# Patient Record
Sex: Female | Born: 1985 | Hispanic: Yes | Marital: Single | Smoking: Never smoker
Health system: Southern US, Community
[De-identification: ages and names within clinical notes are randomized; demographics above are authoritative.]

## PROBLEM LIST (undated history)

## (undated) ENCOUNTER — Inpatient Hospital Stay (HOSPITAL_COMMUNITY): Payer: Self-pay

## (undated) DIAGNOSIS — M1990 Unspecified osteoarthritis, unspecified site: Secondary | ICD-10-CM

## (undated) DIAGNOSIS — Z3480 Encounter for supervision of other normal pregnancy, unspecified trimester: Secondary | ICD-10-CM

## (undated) DIAGNOSIS — Z789 Other specified health status: Secondary | ICD-10-CM

## (undated) DIAGNOSIS — R519 Headache, unspecified: Secondary | ICD-10-CM

## (undated) DIAGNOSIS — F32A Depression, unspecified: Secondary | ICD-10-CM

## (undated) DIAGNOSIS — D649 Anemia, unspecified: Secondary | ICD-10-CM

## (undated) HISTORY — DX: Headache, unspecified: R51.9

## (undated) HISTORY — PX: WISDOM TOOTH EXTRACTION: SHX21

## (undated) HISTORY — DX: Unspecified osteoarthritis, unspecified site: M19.90

## (undated) HISTORY — DX: Anemia, unspecified: D64.9

## (undated) HISTORY — DX: Depression, unspecified: F32.A

---
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
## 2007-05-04 ENCOUNTER — Ambulatory Visit (HOSPITAL_COMMUNITY): Admission: RE | Admit: 2007-05-04 | Discharge: 2007-05-04 | Payer: Self-pay | Admitting: Obstetrics & Gynecology

## 2007-05-27 ENCOUNTER — Inpatient Hospital Stay (HOSPITAL_COMMUNITY): Admission: AD | Admit: 2007-05-27 | Discharge: 2007-05-27 | Payer: Self-pay | Admitting: Family Medicine

## 2007-05-27 ENCOUNTER — Ambulatory Visit: Payer: Self-pay | Admitting: Obstetrics and Gynecology

## 2007-08-31 ENCOUNTER — Inpatient Hospital Stay (HOSPITAL_COMMUNITY): Admission: AD | Admit: 2007-08-31 | Discharge: 2007-09-02 | Payer: Self-pay | Admitting: Obstetrics & Gynecology

## 2007-08-31 ENCOUNTER — Ambulatory Visit: Payer: Self-pay | Admitting: Obstetrics and Gynecology

## 2009-02-15 ENCOUNTER — Emergency Department (HOSPITAL_COMMUNITY): Admission: EM | Admit: 2009-02-15 | Discharge: 2009-02-15 | Payer: Self-pay | Admitting: Emergency Medicine

## 2009-08-27 ENCOUNTER — Emergency Department (HOSPITAL_BASED_OUTPATIENT_CLINIC_OR_DEPARTMENT_OTHER): Admission: EM | Admit: 2009-08-27 | Discharge: 2009-08-27 | Payer: Self-pay | Admitting: Emergency Medicine

## 2009-10-23 ENCOUNTER — Emergency Department (HOSPITAL_BASED_OUTPATIENT_CLINIC_OR_DEPARTMENT_OTHER): Admission: EM | Admit: 2009-10-23 | Discharge: 2009-10-23 | Payer: Self-pay | Admitting: Emergency Medicine

## 2009-11-25 ENCOUNTER — Emergency Department (HOSPITAL_BASED_OUTPATIENT_CLINIC_OR_DEPARTMENT_OTHER): Admission: EM | Admit: 2009-11-25 | Discharge: 2009-11-25 | Payer: Self-pay | Admitting: Emergency Medicine

## 2010-04-30 ENCOUNTER — Ambulatory Visit: Payer: Self-pay | Admitting: Advanced Practice Midwife

## 2010-04-30 ENCOUNTER — Inpatient Hospital Stay (HOSPITAL_COMMUNITY): Admission: AD | Admit: 2010-04-30 | Discharge: 2010-04-30 | Payer: Self-pay | Admitting: Obstetrics & Gynecology

## 2010-06-02 ENCOUNTER — Inpatient Hospital Stay (HOSPITAL_COMMUNITY): Admission: AD | Admit: 2010-06-02 | Discharge: 2010-06-02 | Payer: Self-pay | Admitting: Obstetrics and Gynecology

## 2010-06-02 ENCOUNTER — Ambulatory Visit: Payer: Self-pay | Admitting: Gynecology

## 2010-11-28 LAB — URINALYSIS, ROUTINE W REFLEX MICROSCOPIC
Bilirubin Urine: NEGATIVE
Ketones, ur: NEGATIVE mg/dL
Protein, ur: NEGATIVE mg/dL

## 2010-11-29 LAB — URINALYSIS, ROUTINE W REFLEX MICROSCOPIC
Bilirubin Urine: NEGATIVE
Glucose, UA: NEGATIVE mg/dL
Ketones, ur: 15 mg/dL — AB
Nitrite: NEGATIVE
Protein, ur: NEGATIVE mg/dL
Specific Gravity, Urine: 1.025 (ref 1.005–1.030)
pH: 5.5 (ref 5.0–8.0)

## 2010-11-29 LAB — URINE MICROSCOPIC-ADD ON

## 2010-12-05 ENCOUNTER — Inpatient Hospital Stay (HOSPITAL_COMMUNITY)
Admission: AD | Admit: 2010-12-05 | Discharge: 2010-12-07 | DRG: 775 | Disposition: A | Discharge status: Home / Self Care | Payer: Medicaid Other | Source: Ambulatory Visit | Attending: Obstetrics and Gynecology | Admitting: Obstetrics and Gynecology

## 2010-12-05 DIAGNOSIS — O99892 Other specified diseases and conditions complicating childbirth: Principal | ICD-10-CM | POA: Diagnosis present

## 2010-12-05 DIAGNOSIS — Z22330 Carrier of Group B streptococcus: Secondary | ICD-10-CM

## 2010-12-05 LAB — URINALYSIS, ROUTINE W REFLEX MICROSCOPIC
Bilirubin Urine: NEGATIVE
Glucose, UA: NEGATIVE mg/dL
Ketones, ur: NEGATIVE mg/dL
Nitrite: POSITIVE — AB
Protein, ur: 30 mg/dL — AB
Specific Gravity, Urine: 1.017 (ref 1.005–1.030)
Urobilinogen, UA: 1 mg/dL (ref 0.0–1.0)
pH: 5.5 (ref 5.0–8.0)

## 2010-12-05 LAB — CBC
HCT: 31.3 % — ABNORMAL LOW (ref 36.0–46.0)
MCV: 70 fL — ABNORMAL LOW (ref 78.0–100.0)
RDW: 15.7 % — ABNORMAL HIGH (ref 11.5–15.5)
WBC: 7.6 10*3/uL (ref 4.0–10.5)

## 2010-12-05 LAB — PREGNANCY, URINE: Preg Test, Ur: NEGATIVE

## 2010-12-05 LAB — URINE MICROSCOPIC-ADD ON

## 2010-12-05 LAB — RPR: RPR Ser Ql: NONREACTIVE

## 2010-12-05 LAB — ABO/RH: ABO/RH(D): O POS

## 2010-12-06 LAB — CBC
Hemoglobin: 9 g/dL — ABNORMAL LOW (ref 12.0–15.0)
RDW: 15.9 % — ABNORMAL HIGH (ref 11.5–15.5)
WBC: 10.8 10*3/uL — ABNORMAL HIGH (ref 4.0–10.5)

## 2010-12-17 LAB — CBC
Hemoglobin: 10.7 g/dL — ABNORMAL LOW (ref 12.0–15.0)
MCHC: 33.1 g/dL (ref 30.0–36.0)
Platelets: 309 10*3/uL (ref 150–400)
RBC: 4.42 MIL/uL (ref 3.87–5.11)
RDW: 15.9 % — ABNORMAL HIGH (ref 11.5–15.5)
WBC: 8.9 10*3/uL (ref 4.0–10.5)

## 2010-12-17 LAB — BASIC METABOLIC PANEL
BUN: 8 mg/dL (ref 6–23)
Calcium: 8.3 mg/dL — ABNORMAL LOW (ref 8.4–10.5)
Chloride: 104 mEq/L (ref 96–112)
Creatinine, Ser: 0.6 mg/dL (ref 0.4–1.2)
GFR calc non Af Amer: >60 mL/min (ref >60)
Sodium: 140 mEq/L (ref 135–145)

## 2010-12-17 LAB — URINALYSIS, ROUTINE W REFLEX MICROSCOPIC
Nitrite: NEGATIVE
Protein, ur: NEGATIVE mg/dL

## 2010-12-17 LAB — DIFFERENTIAL
Eosinophils Absolute: 0.1 10*3/uL (ref 0.0–0.7)
Eosinophils Relative: 1 % (ref 0–5)
Lymphocytes Relative: 27 % (ref 12–46)
Monocytes Absolute: 0.4 10*3/uL (ref 0.1–1.0)
Neutrophils Relative %: 67 % (ref 43–77)

## 2010-12-17 LAB — URINE MICROSCOPIC-ADD ON

## 2010-12-19 NOTE — Discharge Summary (Signed)
  NAMEJADEA, [name]               ACCOUNT NO.:  [id number]  MEDICAL RECORD NO.:  [id number]           PATIENT TYPE:  I  LOCATION:  9117                          FACILITY:  [facility]  PHYSICIAN:  Malachi Pro. Ambrose Mantle, M.D. DATE OF BIRTH:  12-31-1985  DATE OF ADMISSION:  12/05/2010 DATE OF DISCHARGE:  12/07/2010                              DISCHARGE SUMMARY   This is a 25 year old Hispanic female para 2-0-0-2 gravida 3 at 38+ weeks' gestation in labor.  Cervix changed from 2 cm in the office to 4 cm in the Maternity Admission Unit, uncomplicated prenatal course, low platelets throughout the care, 75,000 at the initial OB visit.  She had a past medical history of migraines, pyelonephritis.  OBSTETRIC HISTORY:  She had deliveries in 2007 and 2008.  She took prenatal vitamins.  She was not allergic to medications.  SOCIAL HISTORY:  No alcohol, tobacco or drugs.  Family history of diabetes and kidney stones.  PRENATAL LABORATORY FINDINGS:  Blood group and type was O+ with a negative antibody, RPR was nonreactive, hepatitis B negative, HIV negative, GC and Chlamydia negative, cystic fibrosis declined.     Malachi Pro. Ambrose Mantle, M.D.     TFH/MEDQ  D:  12/07/2010  T:  12/08/2010  Job:  [id number]  Electronically Signed by Tracey Harries M.D. on 12/19/2010 08:47:15 AM

## 2010-12-19 NOTE — Discharge Summary (Signed)
  [name], [name]               ACCOUNT NO.:  [id number]  MEDICAL RECORD NO.:  [id number]           PATIENT TYPE:  I  LOCATION:  9117                          FACILITY:  [facility]  PHYSICIAN:  Malachi Pro. Ambrose Mantle, M.D. DATE OF BIRTH:  12-25-1985  DATE OF ADMISSION:  12/05/2010 DATE OF DISCHARGE:  12/07/2010                              DISCHARGE SUMMARY   HISTORY OF PRESENT ILLNESS:  A 25 year old Hispanic female, para 2-0-0- 2, gravida 3 at 59 weeks' gestation, admitted in labor.  Cervix changed from 2 cm in the office to 4 cm in the Maternity Admission Unit. Prenatal course uncomplicated except for low platelets throughout the care 75,000. Initial OB visit, blood group and type O positive with a negative antibody, first trimester screen was normal.  AFP was normal. Pap smear normal, rubella immune, RPR nonreactive, hepatitis B surface antigen negative, HIV negative, platelet count 75,000, GC and Chlamydia negative, cystic fibrosis declined, 1-hour Glucola 88.  PAST MEDICAL HISTORY:  Migraines, anemia, and pyelonephritis.  SURGICAL HISTORY:  Wisdom teeth extracted.  OBSTETRICAL HISTORY:  In October 2007 and December 2008, she had vaginal deliveries 7 pounds 8 ounces female and 6 pounds 4 ounces female.  She had no history of abnormal Paps or STDs.  MEDICATIONS:  Prenatal vitamins.  ALLERGIES:  No known drug allergies.  No latex allergies.  SOCIAL HISTORY:  No alcohol, tobacco, or drugs.  FAMILY HISTORY:  Diabetes, kidney stones.  HOSPITAL COURSE:  On admission her vital signs were normal.  Physical exam was normal.  Cervix 4 cm, 90%.  The patient was positive for group B strep.  She was placed on amoxicillin.  She progressed to 8 cm and by 8:20 a.m. she had delivered a living female infant 6 pounds 13 ounces, Apgars 8 and 9, no lacerations.  Placenta delivered intact.  Cervix and rectum were intact.  Dr. Senaida Ores was in attendance.  Postpartum, the patient did well and was  discharged on the second postpartum day. Initial hemoglobin 9.3, hematocrit 31.3, white count 7600, platelet count 285,000, RPR nonreactive.  Blood group and type O positive. Followup hemoglobin 9.0 and platelet count 271,000.  FINAL DIAGNOSIS:  Intrauterine pregnancy at 38 weeks, delivered vertex.  OPERATION:  Spontaneous delivery vertex.  FINAL CONDITION:  Improved.  INSTRUCTIONS:  Our regular discharge instruction booklet.  DISCHARGE MEDICATIONS: 1. Continue prenatal vitamins. 2. Take ferrous sulfate 325 mg twice daily. 3. Percocet 5/325 15 tablets one every 4-6 hours as needed for pain. 4. Motrin 600 mg 30 tablets one every 6 hours as needed for pain.     Malachi Pro. Ambrose Mantle, M.D.     TFH/MEDQ  D:  12/07/2010  T:  12/08/2010  Job:  [id number]  Electronically Signed by Tracey Harries M.D. on 12/19/2010 08:47:29 AM

## 2010-12-23 LAB — URINE CULTURE

## 2010-12-23 LAB — URINALYSIS, ROUTINE W REFLEX MICROSCOPIC
Bilirubin Urine: NEGATIVE
Hgb urine dipstick: NEGATIVE
Nitrite: NEGATIVE
Specific Gravity, Urine: 1.022 (ref 1.005–1.030)
Urobilinogen, UA: 1 mg/dL (ref 0.0–1.0)

## 2010-12-23 LAB — POCT PREGNANCY, URINE: Preg Test, Ur: NEGATIVE

## 2011-06-20 LAB — CBC
HCT: 31.8 — ABNORMAL LOW
HCT: 35.8 — ABNORMAL LOW
Hemoglobin: 10.9 — ABNORMAL LOW
MCV: 82.3
Platelets: 272
RBC: 3.87
RBC: 4.37
WBC: 14.1 — ABNORMAL HIGH
WBC: 9.2

## 2011-06-20 LAB — RPR: RPR Ser Ql: NONREACTIVE

## 2011-06-27 LAB — URINE MICROSCOPIC-ADD ON

## 2011-06-27 LAB — COMPREHENSIVE METABOLIC PANEL
ALT: 16
AST: 17
Albumin: 2.7 — ABNORMAL LOW
CO2: 24
Calcium: 8.1 — ABNORMAL LOW
Chloride: 106
Creatinine, Ser: 0.43
GFR calc Af Amer: >60
Sodium: 135
Total Bilirubin: 0.6

## 2011-06-27 LAB — URINALYSIS, ROUTINE W REFLEX MICROSCOPIC
Bilirubin Urine: NEGATIVE
Glucose, UA: NEGATIVE
Hgb urine dipstick: NEGATIVE
Ketones, ur: NEGATIVE
Nitrite: NEGATIVE
Specific Gravity, Urine: 1.01
pH: 8

## 2011-06-27 LAB — CBC
MCV: 83.5
Platelets: 237
RBC: 3.86 — ABNORMAL LOW
WBC: 8.3

## 2011-06-27 LAB — AMYLASE: Amylase: 75

## 2011-06-27 LAB — LIPASE, BLOOD: Lipase: 15

## 2011-12-25 ENCOUNTER — Encounter (HOSPITAL_BASED_OUTPATIENT_CLINIC_OR_DEPARTMENT_OTHER): Payer: Self-pay | Admitting: Family Medicine

## 2011-12-25 ENCOUNTER — Emergency Department (INDEPENDENT_AMBULATORY_CARE_PROVIDER_SITE_OTHER): Payer: Medicaid Other

## 2011-12-25 ENCOUNTER — Emergency Department (HOSPITAL_BASED_OUTPATIENT_CLINIC_OR_DEPARTMENT_OTHER)
Admission: EM | Admit: 2011-12-25 | Discharge: 2011-12-25 | Disposition: A | Discharge status: Home / Self Care | Payer: Medicaid Other | Attending: Emergency Medicine | Admitting: Emergency Medicine

## 2011-12-25 DIAGNOSIS — X500XXA Overexertion from strenuous movement or load, initial encounter: Secondary | ICD-10-CM

## 2011-12-25 DIAGNOSIS — S93409A Sprain of unspecified ligament of unspecified ankle, initial encounter: Secondary | ICD-10-CM | POA: Insufficient documentation

## 2011-12-25 DIAGNOSIS — M25579 Pain in unspecified ankle and joints of unspecified foot: Secondary | ICD-10-CM | POA: Insufficient documentation

## 2011-12-25 DIAGNOSIS — S8990XA Unspecified injury of unspecified lower leg, initial encounter: Secondary | ICD-10-CM | POA: Insufficient documentation

## 2011-12-25 DIAGNOSIS — S93401A Sprain of unspecified ligament of right ankle, initial encounter: Secondary | ICD-10-CM

## 2011-12-25 DIAGNOSIS — S99929A Unspecified injury of unspecified foot, initial encounter: Secondary | ICD-10-CM | POA: Insufficient documentation

## 2011-12-25 DIAGNOSIS — M25473 Effusion, unspecified ankle: Secondary | ICD-10-CM | POA: Insufficient documentation

## 2011-12-25 DIAGNOSIS — M7989 Other specified soft tissue disorders: Secondary | ICD-10-CM

## 2011-12-25 DIAGNOSIS — M25476 Effusion, unspecified foot: Secondary | ICD-10-CM | POA: Insufficient documentation

## 2011-12-25 MED ORDER — OXYCODONE-ACETAMINOPHEN 5-325 MG PO TABS: 1.0000 | ORAL_TABLET | ORAL | Status: AC | PRN

## 2011-12-25 MED ORDER — IBUPROFEN 600 MG PO TABS: 600.0000 mg | ORAL_TABLET | Freq: Three times a day (TID) | ORAL | Status: AC | PRN

## 2011-12-25 MED ORDER — OXYCODONE-ACETAMINOPHEN 5-325 MG PO TABS
2.0000 | ORAL_TABLET | Freq: Once | ORAL | Status: AC
  Administered 2011-12-25: 2 via ORAL
  Filled 2011-12-25: qty 2

## 2011-12-25 NOTE — ED Provider Notes (Signed)
History     CSN: [id number]  Arrival date & time 12/25/11  1016   First MD Initiated Contact with Patient 12/25/11 1053      Chief Complaint  Patient presents with  . Ankle Injury     The history is provided by the patient.   the patient reports yesterday she was walking down steps to take the garbage out when she stepped wrong and externally rotated her right foot with inversion of her right foot.  She reports she felt and heard a snap.  She has been able to walk on her right ankle some but reports it has significant pain.  The pain is worsened by palpation movement.  Nothing improves her pain.  Pain is constant.  Her pain is moderate in severity.  She has no other complaints.  She denies numbness and tingling.  She denies weakness.  History reviewed. No pertinent past medical history.  History reviewed. No pertinent past surgical history.  History reviewed. No pertinent family history.  History  Substance Use Topics  . Smoking status: Never Smoker   . Smokeless tobacco: Not on file  . Alcohol Use:     OB History    Grav Para Term Preterm Abortions TAB SAB Ect Mult Living                  Review of Systems  All other systems reviewed and are negative.    Allergies  Review of patient's allergies indicates no known allergies.  Home Medications  No current outpatient prescriptions on file.  BP 122/77  Pulse 71  Temp(Src) 98.5 F (36.9 C) (Oral)  Resp 20  Ht 5\' 5"  (1.651 m)  Wt 160 lb (72.576 kg)  BMI 26.63 kg/m2  SpO2 99%  Physical Exam  Nursing note and vitals reviewed. Constitutional: She is oriented to person, place, and time. She appears well-developed and well-nourished. No distress.  HENT:  Head: Normocephalic and atraumatic.  Eyes: EOM are normal.  Neck: Normal range of motion.  Cardiovascular: Normal rate, regular rhythm and normal heart sounds.   Pulmonary/Chest: Effort normal and breath sounds normal.  Abdominal: Soft. She exhibits no  distension. There is no tenderness.  Musculoskeletal: Normal range of motion.       Tenderness of right lateral malleolus with associated swelling and discoloration.  Normal pulses in her right foot.  Pain with range of motion  Neurological: She is alert and oriented to person, place, and time.  Skin: Skin is warm and dry.  Psychiatric: She has a normal mood and affect. Judgment normal.    ED Course  Procedures (including critical care time)  Labs Reviewed - No data to display Dg Ankle Complete Right  12/25/2011  *RADIOLOGY REPORT*  Clinical Data: Larey Seat down stairs twisting ankle with pain and swelling laterally  RIGHT ANKLE - COMPLETE 3+ VIEW  Comparison: None.  Findings: No acute fracture is seen.  Alignment is normal.  The ankle joint appears normal.  There is soft tissue swelling over the lateral malleolus.  IMPRESSION:   No acute fracture.  Original Report Authenticated By: Juline Patch, M.D.   I personally reviewed the images  1. Right ankle sprain       MDM  Concerning for sprain versus lateral malleolus fracture.  Pain treated.  Images pending  11:45 AM The patient feels much better at this time.  Rice therapy instructed.  Home with crutches and an ASO brace.  Orthopedic surgeon followup in 7-10 days if not  improved.  Patient understands       Lyanne Co, MD 12/25/11 1145

## 2011-12-25 NOTE — ED Notes (Signed)
Pt to room 1 in w/c, reports twisting ankle yesterday on steps, increased swellling and contusions today. Pt states she is able to wb with pain. + pedal pulse, + mvt of toes.

## 2011-12-25 NOTE — Discharge Instructions (Signed)
Esguince de tobillo  (Ankle Sprain)  Un esguince es un traumatismo de los ligamentos que sostienen los huesos juntos.  CAUSAS  La causa de la lesin generalmente es una cada o torcedura del tobillo. Es muy importante que contarle al profesional el modo en que ocurri el traumatismo y si pudo caminar o no inmediatamente despus de ocurrida la lesin.   SNTOMAS  El dolor es el sntoma principal. Puede presentarse durante el descanso o slo cuando trata de pararse o caminar. Es probable que el tobillo se hinche y aparezca un hematoma inmediatamente despus de la lesin o luego de uno o dos das. Puede resultar difcil o imposible pararse a caminar, segn la gravedad del esguince.  DIAGNSTICO  El profesional podr determinar si se trata de un esguince segn los detalles del accidente y basndose en el examen del tobillo. El examen incluir la presin y palpacin de las algunas zonas del pie y el tobillo, intentando moverlo en determinadas direcciones.. Le tomarn radiografas para verificar que el hueso no se haya roto, o que el ligamento no se haya separado del hueso (avulsin). Existen guas estndar que pueden determinar de manera confiable si es necesario tomar radiografas.  TRATAMIENTO  Reposo, hielo, elevacin y compresin del miembro son modos bsicos de tratamiento. Ciertos tipos de dispositivos ortopdicos pueden ayudar a estabilizar el tobillo y permitirle caminar ms rpido. El profesional que lo asiste le dar las indicaciones. Le prescribirn medicamentos para aliviar el dolor. Ser derivado a un ortopedista y a un fisioterapeuta si sufre un caso de esguince grave.  INSTRUCCIONES PARA EL CUIDADO DOMICILIARIO   Aplique hielo sobre la lesin durante 15 a 20 minutos 3 a 4 veces por da mientras se encuentre despierto, durante los 2 primeros das o segn se le indique. Deber suspenderlo cuando disminuya la hinchazn. Ponga el hielo en una bolsa plstica y coloque una toalla entre la bolsa y la  piel.   Mantenga la pierna elevada cuando le sea posible para disminuir la hinchazn.   Para realizar actividades: Si el profesional que lo asiste le ha indicado el uso de muletas, utilcelas segn las instrucciones y no soporte peso durante una semana. Luego podr caminar sobre el tobillo a medida que el dolor se lo permita, o segn se le haya indicado. Comience gradualmente soportando el peso sobre la pierna afectada. Contine usando muletas o bastn hasta que pueda caminar sin dolor.   Si le han colocado una frula de yeso, sela hasta que pueda concurrir al examen de control. Hgala reposar sobre algo no ms duro que una almohada durante las primeras 24 horas. No le coloque peso. No la moje. Puede quitrsela para tomar una ducha o un bao.   Si le colocan una frula de aire, puede insuflar ms aire o quitarle un poco para hacerla ms confortable. Puede quitrsela a la noche para tomar una ducha o un bao. Si puede, mueva los dedos de los pies en la tablilla varias veces al da.   Pueden haberle colocado un vendaje elstico para usar con la frula de yeso o solo. El vendaje est demasiado apretado si siente entumecimiento, hormigueo o si su pie se vuelve fro y azul. Ajuste el vendaje para hacerlo ms confortable.   Utilice los medicamentos de venta libre o de prescripcin para el dolor, el malestar o la fiebre, segn se lo indique el profesional que lo asiste.   No conduzca vehculos hasta que su mdico especficamente le indique que puede hacerlo.  SOLICITE ATENCIN MDICA SI:     Aumenta el hematoma, la hinchazn o el dolor.   Nota que los dedos de los pies estn fros.   No consigue aliviar el dolor con la medicacin.  SOLICITE ATENCIN MDICA DE INMEDIATO SI:  Los dedos de los pies estn entumecidos o azules o siente un dolor intenso.  EST SEGURO QUE:    Comprende las instrucciones para el alta mdica.   Controlar su enfermedad.   Solicitar atencin mdica de inmediato segn las  indicaciones.  Document Released: 09/01/2005 Document Revised: 08/21/2011  [employer] Patient Information 2012 [employer].

## 2012-11-02 ENCOUNTER — Encounter (HOSPITAL_COMMUNITY): Payer: Self-pay | Admitting: *Deleted

## 2012-11-02 ENCOUNTER — Inpatient Hospital Stay (HOSPITAL_COMMUNITY): Payer: Self-pay

## 2012-11-02 ENCOUNTER — Inpatient Hospital Stay (HOSPITAL_COMMUNITY)
Admission: AD | Admit: 2012-11-02 | Discharge: 2012-11-02 | Disposition: A | Discharge status: Home / Self Care | Payer: Self-pay | Source: Ambulatory Visit | Attending: Obstetrics and Gynecology | Admitting: Obstetrics and Gynecology

## 2012-11-02 DIAGNOSIS — N920 Excessive and frequent menstruation with regular cycle: Secondary | ICD-10-CM | POA: Insufficient documentation

## 2012-11-02 HISTORY — DX: Other specified health status: Z78.9

## 2012-11-02 LAB — CBC
Hemoglobin: 13 g/dL (ref 12.0–15.0)
Platelets: 286 10*3/uL (ref 150–400)
RBC: 4.42 MIL/uL (ref 3.87–5.11)
WBC: 7.7 10*3/uL (ref 4.0–10.5)

## 2012-11-02 LAB — WET PREP, GENITAL
Clue Cells Wet Prep HPF POC: NONE SEEN
Trich, Wet Prep: NONE SEEN
Yeast Wet Prep HPF POC: NONE SEEN

## 2012-11-02 MED ORDER — NORGESTIMATE-ETH ESTRADIOL 0.25-35 MG-MCG PO TABS: 1.0000 | ORAL_TABLET | Freq: Every day | ORAL | Status: DC

## 2012-11-02 NOTE — MAU Note (Signed)
Patient states she had a Mirena in 2012. States the Mirena came out on 2-14. Started bleeding on 2-15 and getting heavier with passing clots. No pain.

## 2012-11-02 NOTE — MAU Provider Note (Signed)
History    CSN: [id number]  Arrival date and time: 11/02/12 1319   First Provider Initiated Contact with Patient 11/02/12 1622     Chief Complaint  Patient presents with  . Vaginal Bleeding   HPI [name] is a 27 yo G3P3 female who presents to the MAU with complaints of vaginal bleeding x 4 days. She reports that Friday evening she was taking a shower and felt that the strings from her Mirena were much lower than normal. She then tugged on the strings and the IUD came out very easily. She reports that she had the Mirena in for 2 years with no complications. She reports that she previously had the Paragard IUD and that it also fell out after being in for 1 year. The patient has had 3 pregnancies resulting in 3 vaginal deliveries.   She denies any bleeding when the Mirena came out on Friday. However, on Saturday she reports that she started having large amounts of vaginal bleeding. She states that every time she goes to the bathroom over the last 3 days she has "quarter-sized clots". She reports that she is still bleeding now. She denies any vaginal discharge, and also denies any abdominal pain or cramping.   She does report a history of iron-deficiency anemia, however reports that she cannot tolerate iron supplements due to constipation so she is very aware to eat iron-containing foods. She reports feeling more cold over the last 2 days, and report mild dizziness but no fainting. She reports that she has been doing well to stay hydrated. She has also previously been on Yaz OCPs and liked being on the pill for contraception better than the IUD.  OB History   Grav Para Term Preterm Abortions TAB SAB Ect Mult Living   3 3 3       3       Past Medical History  Diagnosis Date  . Medical history non-contributory     Past Surgical History  Procedure Laterality Date  . Wisdom tooth extraction      History reviewed. No pertinent family history.  History  Substance Use Topics  .  Smoking status: Never Smoker   . Smokeless tobacco: Not on file  . Alcohol Use: No    Allergies: No Known Allergies  Prescriptions prior to admission  Medication Sig Dispense Refill  . omega-3 acid ethyl esters (LOVAZA) 1 G capsule Take 2 g by mouth 2 (two) times daily.        Review of Systems  Constitutional: Positive for chills (cold feet over the last two days). Negative for malaise/fatigue.  Gastrointestinal: Negative for abdominal pain.  Genitourinary: Negative for dysuria, urgency and frequency.  Neurological: Negative for dizziness, loss of consciousness, weakness and headaches.    Physical Exam   Blood pressure 122/81, pulse 82, temperature 98.3 F (36.8 C), temperature source Oral, resp. rate 16, height 5\' 5"  (1.651 m), weight 75.206 kg (165 lb 12.8 oz), last menstrual period 10/30/2012, SpO2 100.00%.  Physical Exam Physical Examination:  General appearance - alert, well appearing, and in no distress and oriented to person, place, and time Chest - clear to auscultation, no wheezes, rales or rhonchi, symmetric air entry Heart - normal rate, regular rhythm, normal S1, S2, no murmurs, rubs, clicks or gallops Abdomen - soft, nontender, nondistended, no masses or organomegaly Pelvic - normal external genitalia, vulva, vagina, cervix, uterus and adnexa, VULVA: normal appearing vulva with no masses, tenderness or lesions, VAGINA: normal appearing vagina with normal color, no  lesions, vaginal discharge - moderate bloody discharge in vaginal vault, CERVIX: cervical discharge present - bloody, cervical motion tenderness absent, multiparous os, UTERUS: uterus is normal size, shape, consistency and nontender, ADNEXA: normal adnexa in size, nontender and no masses Extremities - peripheral pulses normal, no pedal edema, no clubbing or cyanosis Skin - normal coloration and turgor, no rashes, no suspicious skin lesions noted  MAU Course  Procedures  Results for orders placed during the  hospital encounter of 11/02/12 (from the past 24 hour(s))  POCT PREGNANCY, URINE     Status: None   Collection Time    11/02/12  1:57 PM      Result Value Range   Preg Test, Ur NEGATIVE  NEGATIVE  CBC     Status: None   Collection Time    11/02/12  4:42 PM      Result Value Range   WBC 7.7  4.0 - 10.5 K/uL   RBC 4.42  3.87 - 5.11 MIL/uL   Hemoglobin 13.0  12.0 - 15.0 g/dL   HCT 16.1  09.6 - 04.5 %   MCV 86.2  78.0 - 100.0 fL   MCH 29.4  26.0 - 34.0 pg   MCHC 34.1  30.0 - 36.0 g/dL   RDW 40.9  81.1 - 91.4 %   Platelets 286  150 - 400 K/uL  RADIOLOGY REPORT*  Clinical Data: Vaginal bleeding after IUD removal.  TRANSABDOMINAL AND TRANSVAGINAL ULTRASOUND OF PELVIS  Technique: Both transabdominal and transvaginal ultrasound  examinations of the pelvis were performed. Transabdominal technique  was performed for global imaging of the pelvis including uterus,  ovaries, adnexal regions, and pelvic cul-de-sac.  It was necessary to proceed with endovaginal exam following the  transabdominal exam to visualize the adnexa.  Comparison: 04/30/2010  Findings:  Uterus: 9.1 x 4.8 x 5.7 cm. No myometrial masses.  Endometrium: Uniform in thickness. 4 mm in thickness.  Right ovary: Normal appearance/no adnexal mass  Left ovary: Normal appearance/no adnexal mass  Other findings: No free fluid  IMPRESSION:  Within normal limits. No endometrial or myometrial masses.  Original Report Authenticated By: Jolaine Click, M.D.   Reviewed report with pt- normal findings.  Pt prefers to restart OCs.  Due to that fact that pt does not have insurance, will give $9 OC sprintec Pt will f/u with [facility]  Assessment and Plan  Menorrhagia- normal ultrasound findings Start Sprintec- may take 2 x d until bleeding stops and then one daily F/u with Joretta Bachelor, Katie 11/02/2012, 4:35 PM

## 2012-11-03 LAB — GC/CHLAMYDIA PROBE AMP
CT Probe RNA: NEGATIVE
GC Probe RNA: NEGATIVE

## 2012-11-04 NOTE — MAU Provider Note (Signed)
Attestation of Attending Supervision of Advanced Practitioner (CNM/NP): Evaluation and management procedures were performed by the Advanced Practitioner under my supervision and collaboration.  I have reviewed the Advanced Practitioner's note and chart, and I agree with the management and plan.  Marlane Hirschmann 11/04/2012 12:07 PM

## 2012-11-21 IMAGING — CR DG ANKLE COMPLETE 3+V*R*
3 series · 3 of 3 positions shown · non-contrast
Comparison: None.

CLINICAL DATA: Fell down stairs twisting ankle with pain and
swelling laterally

RIGHT ANKLE - COMPLETE 3+ VIEW

[t ankle joint ap right]
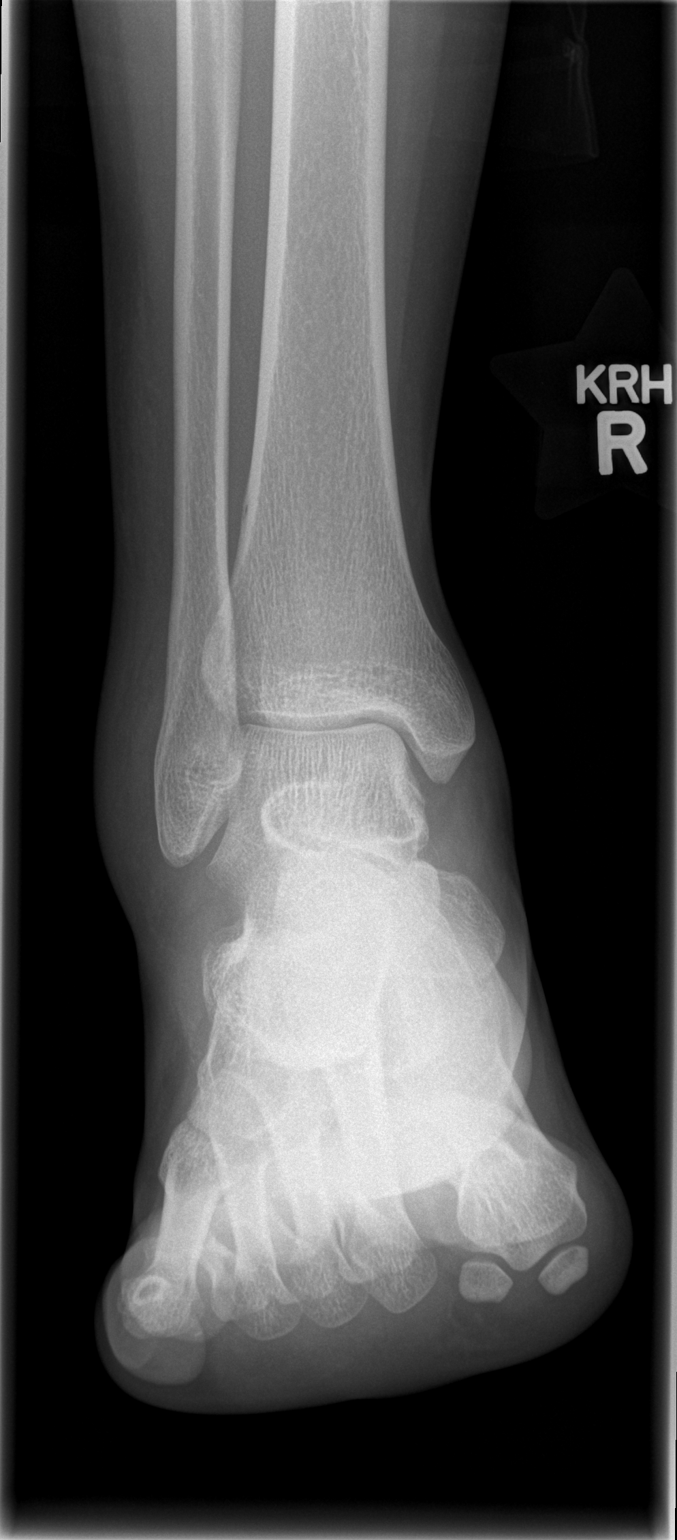

[t ankle joint oblique right]
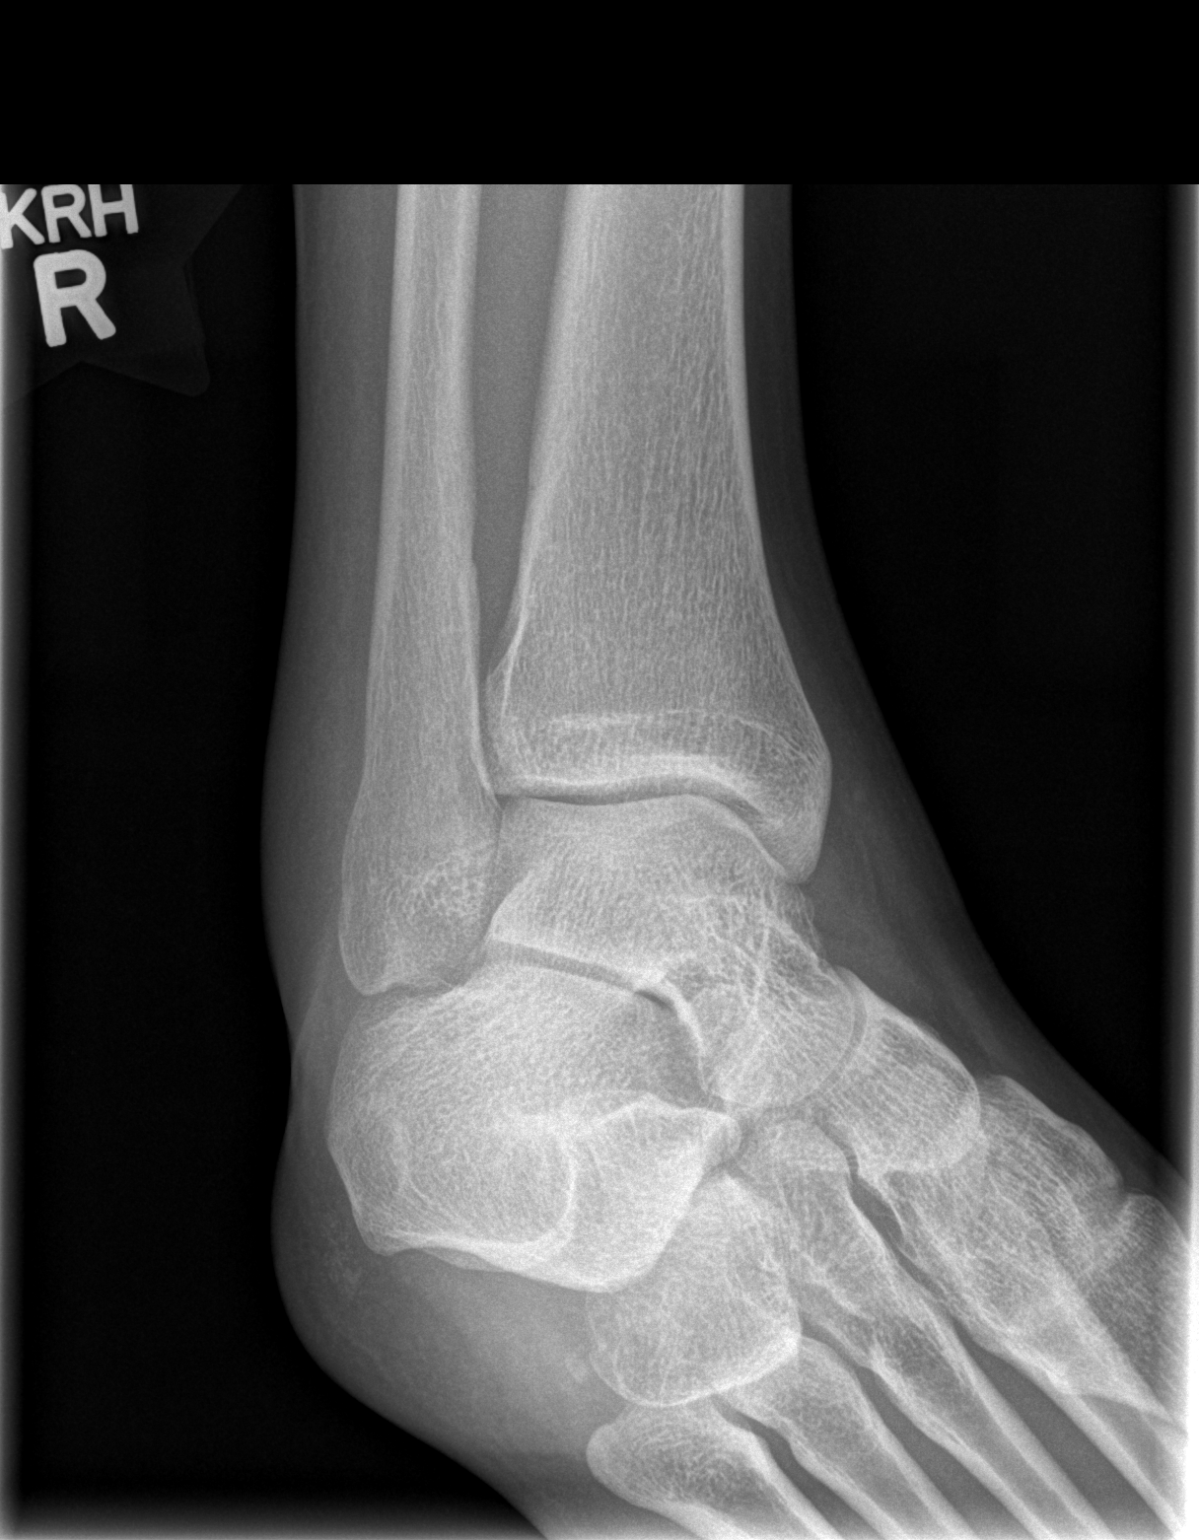

[t ankle joint lat right]
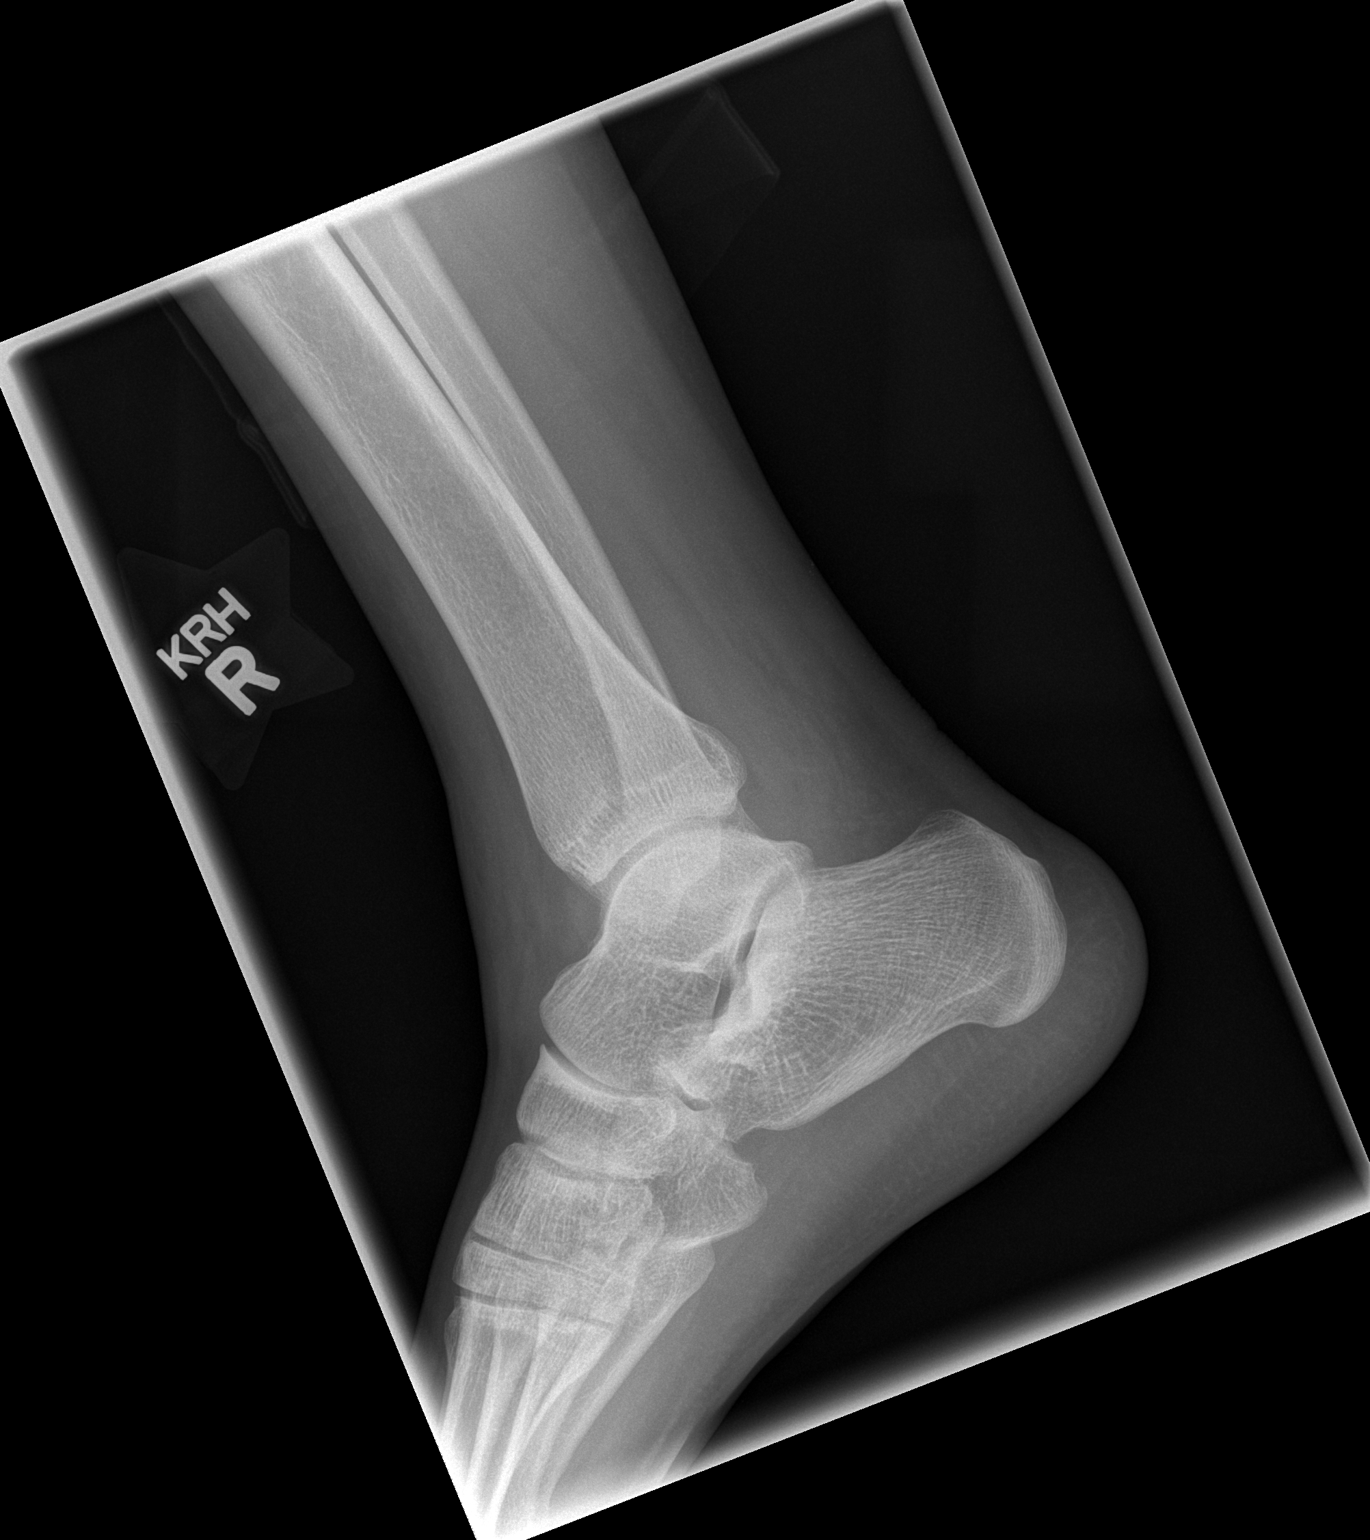

[3 of 3 positions shown; findings below may reference images not displayed]

FINDINGS: No acute fracture is seen.  Alignment is normal.  The
ankle joint appears normal.  There is soft tissue swelling over the
lateral malleolus.
IMPRESSION: No acute fracture.

## 2013-07-22 LAB — OB RESULTS CONSOLE ANTIBODY SCREEN: Antibody Screen: NEGATIVE

## 2013-07-22 LAB — OB RESULTS CONSOLE HEPATITIS B SURFACE ANTIGEN: HEP B S AG: NEGATIVE

## 2013-07-22 LAB — OB RESULTS CONSOLE ABO/RH: RH Type: POSITIVE

## 2013-07-22 LAB — OB RESULTS CONSOLE GC/CHLAMYDIA
CHLAMYDIA, DNA PROBE: NEGATIVE
GC PROBE AMP, GENITAL: NEGATIVE

## 2013-07-22 LAB — OB RESULTS CONSOLE RUBELLA ANTIBODY, IGM: Rubella: IMMUNE

## 2013-07-22 LAB — OB RESULTS CONSOLE HIV ANTIBODY (ROUTINE TESTING): HIV: NONREACTIVE

## 2013-07-22 LAB — OB RESULTS CONSOLE RPR: RPR: NONREACTIVE

## 2013-09-15 NOTE — L&D Delivery Note (Signed)
Delivery Note At 2:22 PM a viable and healthy female was delivered via  (Presentation: OA  ).  APGAR: 9,9 ; weight P.   Placenta status: delivered, intact .  Cord: 3V with the following complications: none.    Anesthesia:  IV pain meds Episiotomy: none Lacerations: none Suture Repair: N/A Est. Blood Loss (mL): 400cc  Mom to postpartum.  Baby to Couplet care / Skin to Skin.  Haile Toppins Bovard-Stuckert 01/26/2014, 2:34 PM  Br/O+/RI/ Contra - Nexplanon  Pt declines circumcision

## 2013-09-30 IMAGING — US US PELVIS COMPLETE
1 series · 14 of 25 positions shown · non-contrast
Comparison: 04/30/2010

CLINICAL DATA: Vaginal bleeding after IUD removal.



[Series 1: us pelvis complete · 14 of 40 slices shown]
[im 1/40]
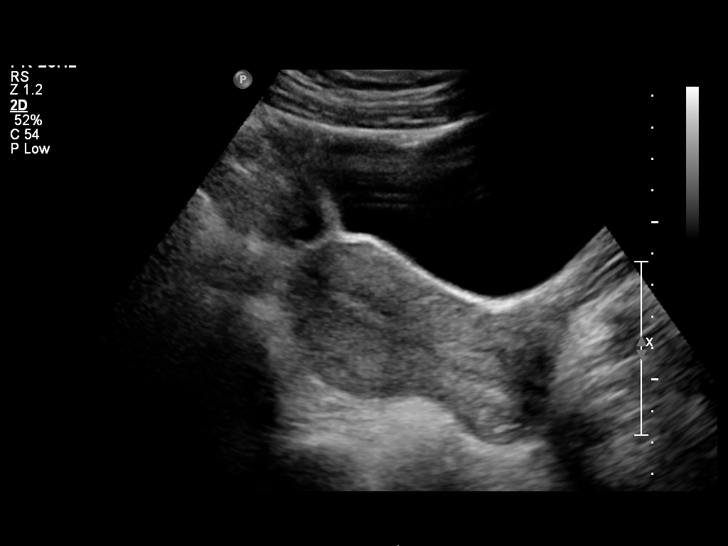
[im 4/40]
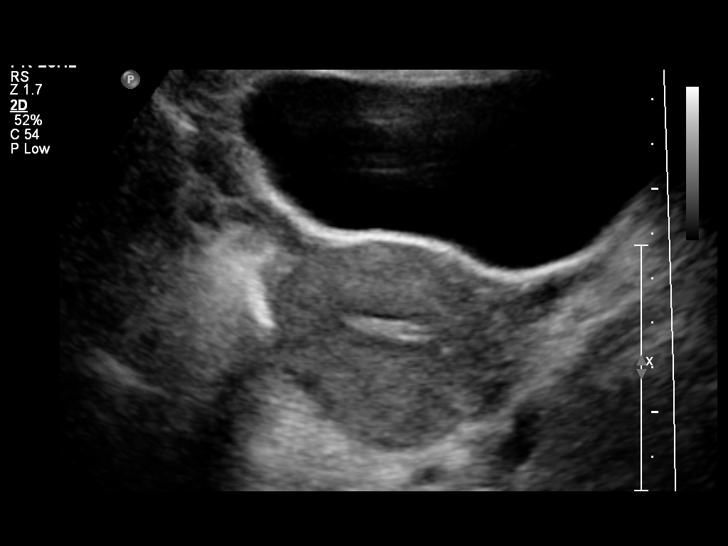
[im 7/40]
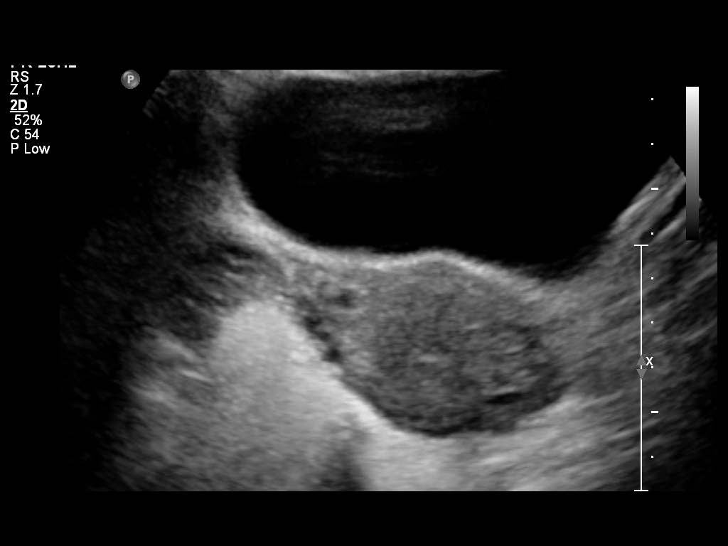
[im 10/40]
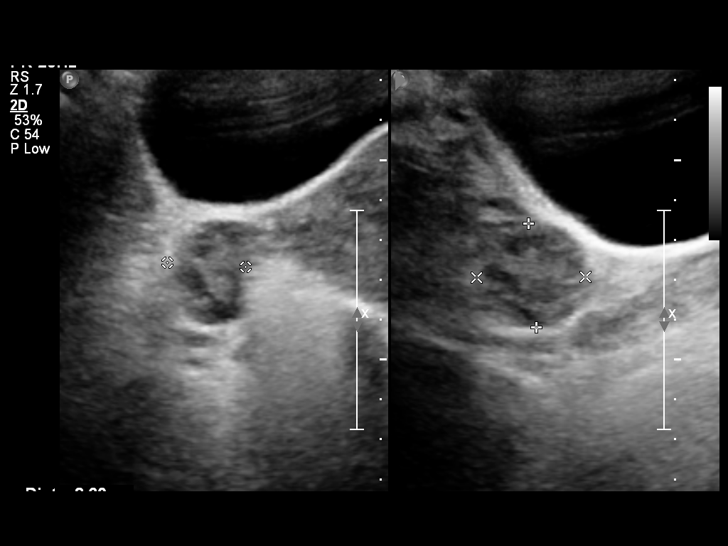
[im 14/40]
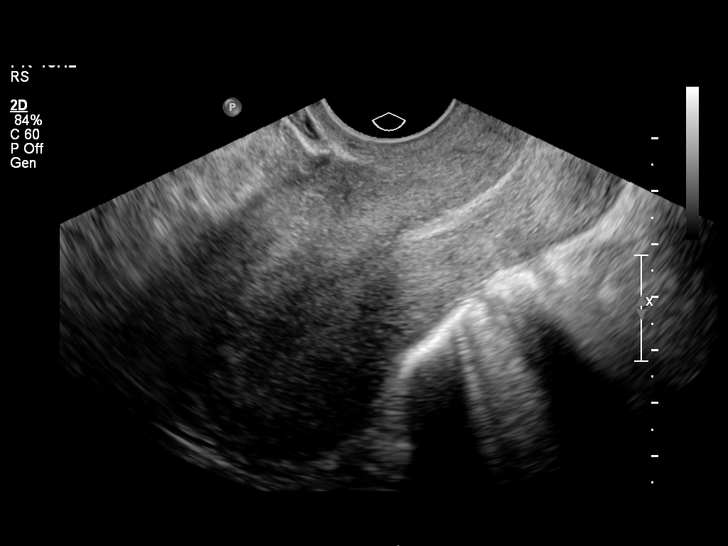
[im 15/40]
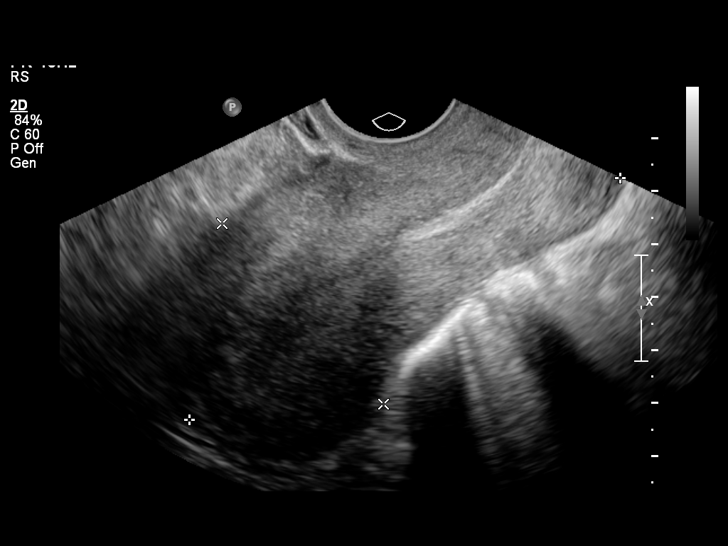
[im 18/40]
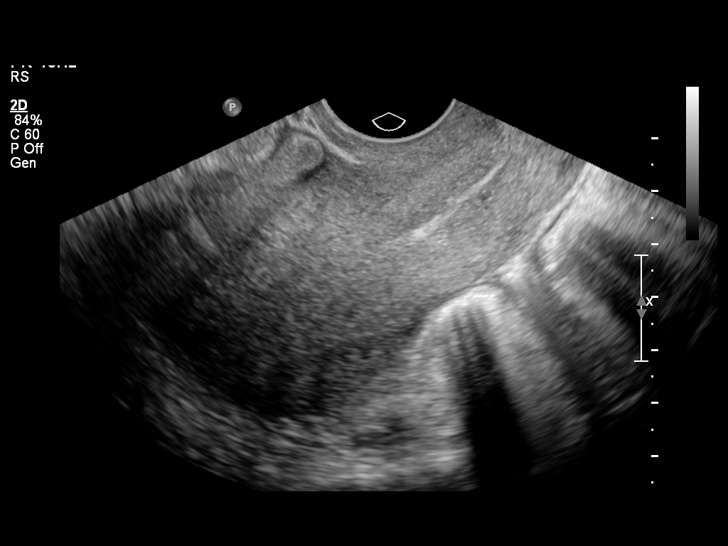
[im 22/40]
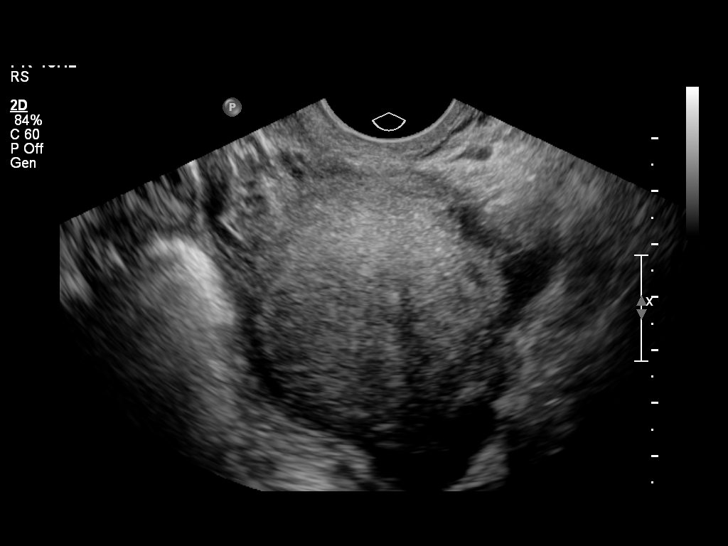
[im 25/40]
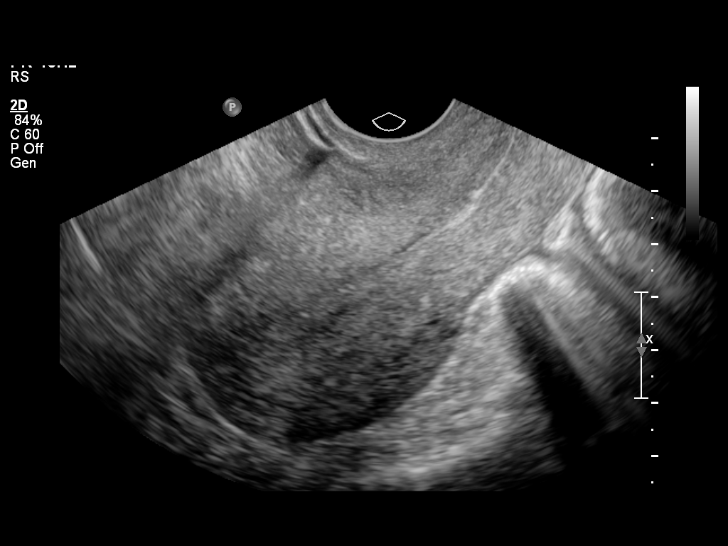
[im 27/40]
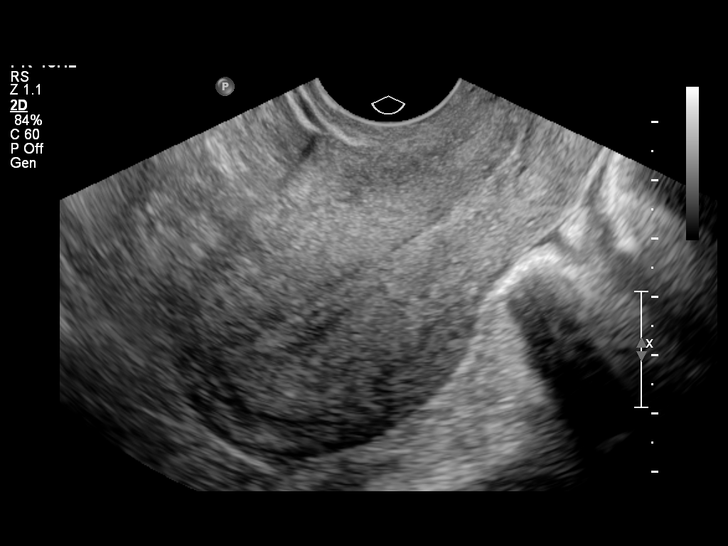
[im 30/40]
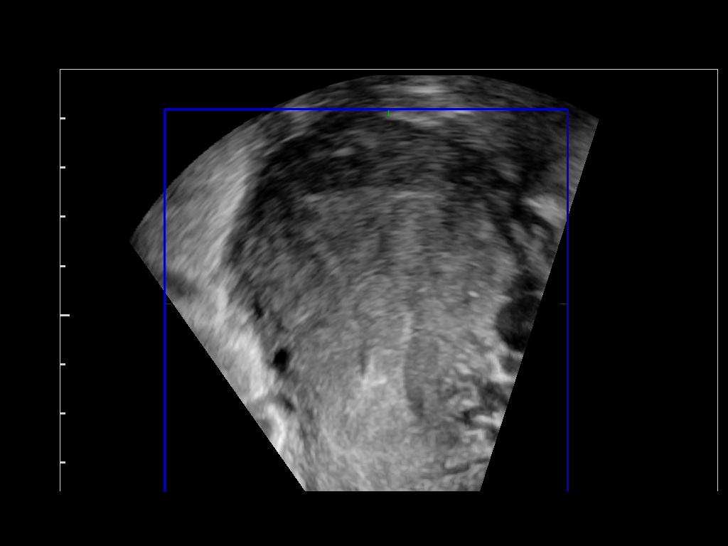
[im 33/40]
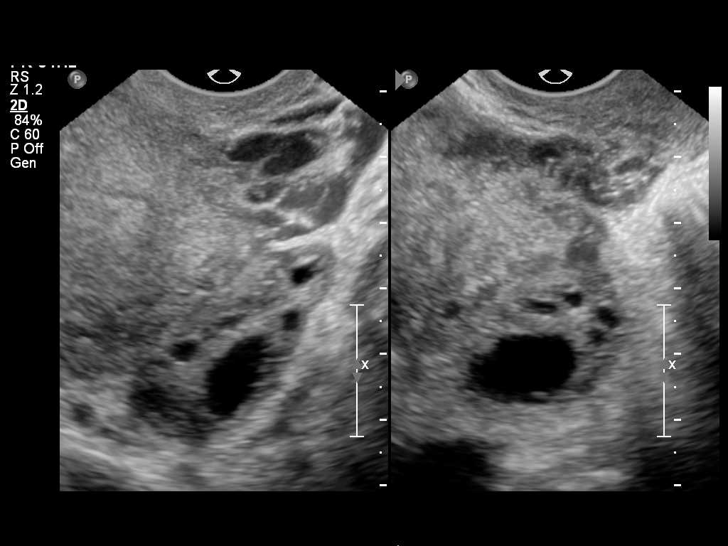
[im 36/40]
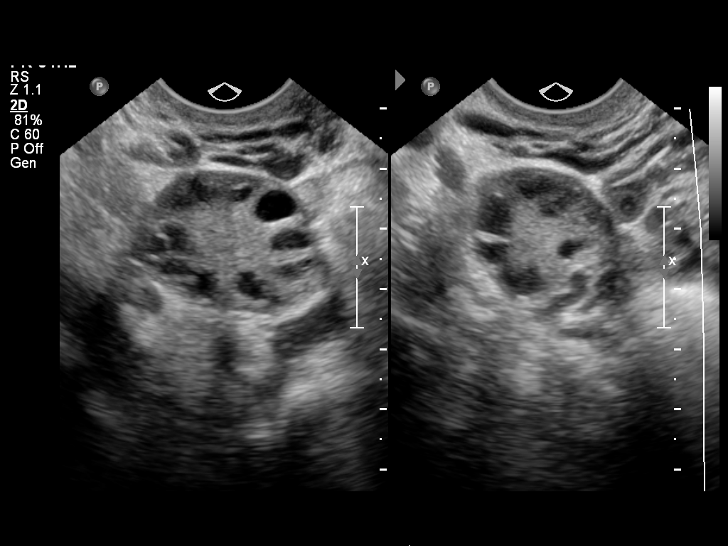
[im 40/40]
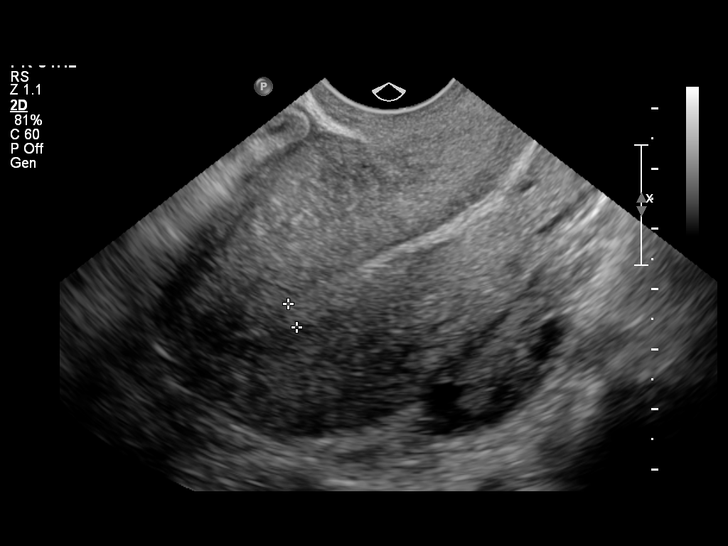

[14 of 25 positions shown; findings below may reference images not displayed]

FINDINGS: Uterus: 9.1 x 4.8 x 5.7 cm.  No myometrial masses.

Endometrium: Uniform in thickness.  4 mm in thickness.

Right ovary:  Normal appearance/no adnexal mass

Left ovary: Normal appearance/no adnexal mass

Other findings: No free fluid
IMPRESSION: Within normal limits.  No endometrial or myometrial masses..

## 2014-01-05 LAB — OB RESULTS CONSOLE GBS: STREP GROUP B AG: NEGATIVE

## 2014-01-25 ENCOUNTER — Encounter (HOSPITAL_COMMUNITY): Payer: Self-pay

## 2014-01-25 ENCOUNTER — Telehealth (HOSPITAL_COMMUNITY): Payer: Self-pay | Admitting: *Deleted

## 2014-01-25 ENCOUNTER — Encounter (HOSPITAL_COMMUNITY): Payer: Self-pay | Admitting: *Deleted

## 2014-01-25 DIAGNOSIS — Z3480 Encounter for supervision of other normal pregnancy, unspecified trimester: Secondary | ICD-10-CM

## 2014-01-25 HISTORY — DX: Encounter for supervision of other normal pregnancy, unspecified trimester: Z34.80

## 2014-01-25 NOTE — Telephone Encounter (Signed)
Preadmission screen  

## 2014-01-25 NOTE — H&P (Signed)
[name] is a 28 y.o. female [phone] at 1539 for iol given term, favorable, history of rapid delivery.  +FM, no LOF, no VB, occ ctx.  Relatively uncomplicated prenatal care.  GBBS neg.  Maternal Medical History:  Contractions: Frequency: irregular.   Perceived severity is moderate.    Fetal activity: Perceived fetal activity is normal.    Prenatal complications: no prenatal complications Prenatal Complications - Diabetes: none.    OB History   Grav Para Term Preterm Abortions TAB SAB Ect Mult Living   4 3 3       3     G1 TSVD 7#8 female, G2 TSVD 6#4 female, G3 TSVD 6#13 female, G4 present. No abn pap No STDs  Past Medical History  Diagnosis Date  . Medical history non-contributory   . Normal pregnancy, repeat 01/25/2014   Past Surgical History  Procedure Laterality Date  . Wisdom tooth extraction     Family History: family history is negative for Alcohol abuse, Arthritis, Asthma, Birth defects, Cancer, COPD, Depression, Diabetes, Drug abuse, Early death, Hearing loss, Heart disease, Hyperlipidemia, Hypertension, Kidney disease, Learning disabilities, Mental illness, Mental retardation, Miscarriages / Stillbirths, Stroke, Vision loss, and Varicose Veins. + DM, kidney stone,  Social History:  reports that she has never smoked. She has never used smokeless tobacco. She reports that she does not drink alcohol or use illicit drugs.single, sales  Meds PNV All NKDA   Prenatal Transfer Tool  Maternal Diabetes: No Genetic Screening: Normal Maternal Ultrasounds/Referrals: Normal Fetal Ultrasounds or other Referrals:  None Maternal Substance Abuse:  No Significant Maternal Medications:  None Significant Maternal Lab Results:  Lab values include: Group B Strep negative Other Comments:  None  Review of Systems  Constitutional: Negative.   HENT: Negative.   Eyes: Negative.   Respiratory: Negative.   Cardiovascular: Negative.   Gastrointestinal: Negative.   Genitourinary:  Negative.   Musculoskeletal: Negative.   Skin: Negative.   Neurological: Negative.   Psychiatric/Behavioral: Negative.       Last menstrual period 04/28/2013. Maternal Exam:  Uterine Assessment: Contraction frequency is irregular.   Abdomen: Fundal height is appropriate for gestation.   Estimated fetal weight is 7-8#.   Fetal presentation: vertex  Introitus: Normal vulva. Normal vagina.  Pelvis: adequate for delivery.   Cervix: Cervix evaluated by digital exam.     Physical Exam  Constitutional: She is oriented to person, place, and time. She appears well-developed and well-nourished.  HENT:  Head: Normocephalic and atraumatic.  Cardiovascular: Normal rate and regular rhythm.   Respiratory: Effort normal and breath sounds normal. No respiratory distress. She has no wheezes.  GI: Soft. Bowel sounds are normal. She exhibits no distension. There is no tenderness.  Musculoskeletal: Normal range of motion.  Neurological: She is alert and oriented to person, place, and time.  Skin: Skin is warm and dry.  Psychiatric: She has a normal mood and affect. Her behavior is normal.    Prenatal labs: ABO, Rh: O/Positive/-- (11/07 0000) Antibody: Negative (11/07 0000) Rubella: Immune (11/07 0000) RPR: Nonreactive (11/07 0000)  HBsAg: Negative (11/07 0000)  HIV: Non-reactive (11/07 0000)  GBS: Negative (04/23 0000)   Hgb 11.3/Ur Cx neg/ GC neg/ Chl neg/Pap WNL/glucola 102/ Plt 254 K/ First Tri and AFP WNL  First Tri nl US cwd US cwd, nl anat,post plac, female  Flu 11/14 Tdap 11/24/13  Assessment/Plan: 27yo [id number] at 39+ for IOL  AROM/pitocin for IOL gbbs negative Epidural or IV pain meds PRN Expect SVD  [name] 01/25/2014, 9:14 PM

## 2014-01-26 ENCOUNTER — Inpatient Hospital Stay (HOSPITAL_COMMUNITY)
Admission: RE | Admit: 2014-01-26 | Discharge: 2014-01-27 | DRG: 775 | Disposition: A | Discharge status: Home / Self Care | Payer: Medicaid Other | Source: Ambulatory Visit | Attending: Obstetrics and Gynecology | Admitting: Obstetrics and Gynecology

## 2014-01-26 ENCOUNTER — Encounter (HOSPITAL_COMMUNITY): Payer: Self-pay

## 2014-01-26 VITALS — BP 112/73 | HR 78 | Temp 97.9°F | Resp 18 | Ht 65.0 in | Wt 190.0 lb

## 2014-01-26 DIAGNOSIS — Z833 Family history of diabetes mellitus: Secondary | ICD-10-CM

## 2014-01-26 DIAGNOSIS — Z3480 Encounter for supervision of other normal pregnancy, unspecified trimester: Secondary | ICD-10-CM

## 2014-01-26 HISTORY — DX: Encounter for supervision of other normal pregnancy, unspecified trimester: Z34.80

## 2014-01-26 LAB — CBC
HCT: 30.8 % — ABNORMAL LOW (ref 36.0–46.0)
Hemoglobin: 9.6 g/dL — ABNORMAL LOW (ref 12.0–15.0)
MCH: 22.2 pg — AB (ref 26.0–34.0)
MCHC: 31.2 g/dL (ref 30.0–36.0)
MCV: 71.1 fL — ABNORMAL LOW (ref 78.0–100.0)
PLATELETS: 345 10*3/uL (ref 150–400)
RBC: 4.33 MIL/uL (ref 3.87–5.11)
RDW: 15.2 % (ref 11.5–15.5)
WBC: 7.2 10*3/uL (ref 4.0–10.5)

## 2014-01-26 LAB — TYPE AND SCREEN
ABO/RH(D): O POS
ANTIBODY SCREEN: NEGATIVE

## 2014-01-26 LAB — RPR

## 2014-01-26 MED ORDER — SIMETHICONE 80 MG PO CHEW
80.0000 mg | CHEWABLE_TABLET | ORAL | Status: DC | PRN
Start: 2014-01-26 — End: 2014-01-27

## 2014-01-26 MED ORDER — ONDANSETRON HCL 4 MG PO TABS: 4.0000 mg | ORAL_TABLET | ORAL | Status: DC | PRN

## 2014-01-26 MED ORDER — OXYTOCIN 40 UNITS IN LACTATED RINGERS INFUSION - SIMPLE MED
1.0000 m[IU]/min | INTRAVENOUS | Status: DC
  Administered 2014-01-26: 2 m[IU]/min via INTRAVENOUS
  Filled 2014-01-26: qty 1000

## 2014-01-26 MED ORDER — DIPHENHYDRAMINE HCL 25 MG PO CAPS: 25.0000 mg | ORAL_CAPSULE | Freq: Four times a day (QID) | ORAL | Status: DC | PRN

## 2014-01-26 MED ORDER — IBUPROFEN 600 MG PO TABS
600.0000 mg | ORAL_TABLET | Freq: Four times a day (QID) | ORAL | Status: DC
  Administered 2014-01-26 – 2014-01-27 (×3): 600 mg via ORAL
  Filled 2014-01-26 (×3): qty 1

## 2014-01-26 MED ORDER — ONDANSETRON HCL 4 MG/2ML IJ SOLN: 4.0000 mg | INTRAMUSCULAR | Status: DC | PRN

## 2014-01-26 MED ORDER — OXYTOCIN 40 UNITS IN LACTATED RINGERS INFUSION - SIMPLE MED
62.5000 mL/h | INTRAVENOUS | Status: DC
  Administered 2014-01-26: 62.5 mL/h via INTRAVENOUS

## 2014-01-26 MED ORDER — LIDOCAINE HCL (PF) 1 % IJ SOLN
30.0000 mL | INTRAMUSCULAR | Status: DC | PRN
  Filled 2014-01-26: qty 30

## 2014-01-26 MED ORDER — TERBUTALINE SULFATE 1 MG/ML IJ SOLN: 0.2500 mg | Freq: Once | INTRAMUSCULAR | Status: AC | PRN

## 2014-01-26 MED ORDER — ACETAMINOPHEN 325 MG PO TABS: 650.0000 mg | ORAL_TABLET | ORAL | Status: DC | PRN

## 2014-01-26 MED ORDER — LACTATED RINGERS IV SOLN
INTRAVENOUS | Status: DC
  Administered 2014-01-26: 08:00:00 via INTRAVENOUS

## 2014-01-26 MED ORDER — SENNOSIDES-DOCUSATE SODIUM 8.6-50 MG PO TABS
2.0000 | ORAL_TABLET | ORAL | Status: DC
  Administered 2014-01-27: 2 via ORAL
  Filled 2014-01-26: qty 2

## 2014-01-26 MED ORDER — OXYCODONE-ACETAMINOPHEN 5-325 MG PO TABS: 1.0000 | ORAL_TABLET | ORAL | Status: DC | PRN

## 2014-01-26 MED ORDER — ONDANSETRON HCL 4 MG/2ML IJ SOLN: 4.0000 mg | Freq: Four times a day (QID) | INTRAMUSCULAR | Status: DC | PRN

## 2014-01-26 MED ORDER — DIBUCAINE 1 % RE OINT: 1.0000 "application " | TOPICAL_OINTMENT | RECTAL | Status: DC | PRN

## 2014-01-26 MED ORDER — ZOLPIDEM TARTRATE 5 MG PO TABS: 5.0000 mg | ORAL_TABLET | Freq: Every evening | ORAL | Status: DC | PRN

## 2014-01-26 MED ORDER — CITRIC ACID-SODIUM CITRATE 334-500 MG/5ML PO SOLN: 30.0000 mL | ORAL | Status: DC | PRN

## 2014-01-26 MED ORDER — LANOLIN HYDROUS EX OINT: TOPICAL_OINTMENT | CUTANEOUS | Status: DC | PRN

## 2014-01-26 MED ORDER — LACTATED RINGERS IV SOLN
500.0000 mL | INTRAVENOUS | Status: DC | PRN
Start: 2014-01-26 — End: 2014-01-26

## 2014-01-26 MED ORDER — IBUPROFEN 600 MG PO TABS
600.0000 mg | ORAL_TABLET | Freq: Four times a day (QID) | ORAL | Status: DC | PRN
  Administered 2014-01-26: 600 mg via ORAL
  Filled 2014-01-26: qty 1

## 2014-01-26 MED ORDER — BUTORPHANOL TARTRATE 1 MG/ML IJ SOLN
2.0000 mg | INTRAMUSCULAR | Status: DC | PRN
  Administered 2014-01-26: 2 mg via INTRAVENOUS
  Filled 2014-01-26: qty 2

## 2014-01-26 MED ORDER — BENZOCAINE-MENTHOL 20-0.5 % EX AERO: 1.0000 "application " | INHALATION_SPRAY | CUTANEOUS | Status: DC | PRN

## 2014-01-26 MED ORDER — OXYTOCIN BOLUS FROM INFUSION: 500.0000 mL | INTRAVENOUS | Status: DC

## 2014-01-26 MED ORDER — PRENATAL MULTIVITAMIN CH
1.0000 | ORAL_TABLET | Freq: Every day | ORAL | Status: DC
  Administered 2014-01-27: 1 via ORAL
  Filled 2014-01-26: qty 1

## 2014-01-26 MED ORDER — WITCH HAZEL-GLYCERIN EX PADS
1.0000 "application " | MEDICATED_PAD | CUTANEOUS | Status: DC | PRN
  Administered 2014-01-26: 1 via TOPICAL

## 2014-01-26 MED ORDER — LACTATED RINGERS IV SOLN
INTRAVENOUS | Status: DC
Start: 2014-01-26 — End: 2014-01-27

## 2014-01-26 MED ORDER — OXYCODONE-ACETAMINOPHEN 5-325 MG PO TABS
1.0000 | ORAL_TABLET | ORAL | Status: DC | PRN
  Administered 2014-01-27: 2 via ORAL
  Filled 2014-01-26: qty 2

## 2014-01-26 NOTE — Progress Notes (Signed)
Delivery of live viable female by Dr Ellyn HackBovard. APGARS 9,9

## 2014-01-26 NOTE — Progress Notes (Signed)
Please cancel L/D orders. Thanks

## 2014-01-26 NOTE — Lactation Note (Signed)
This note was copied from the chart of [name]. Lactation Consultation Note  Patient Name: Boy [name] ZOXWR'UToday's Date: 01/26/2014 Reason for consult: Initial assessment of this multipara and her newborn, now 267 hours old. Mom states she was only able to breastfeed other 3 children for 2-4 weeks each due to developing sore nipples after breast engorgement onset.  LC reviewed preventive measures for sore nipples and provided a hand pump which mom states she used before and knows how to use. LC encouraged her to do pre-pumping prior to latch when breasts start to feel fuller and ensure baby achieves a deep latch.  LC also suggests supporting her breasts throughout feedings.  Most recent LATCH score=9 per RN, Clydie BraunKaren who reviewed and documented teaching hand expression.  LC reviewed and recommends ebm for nipple care after feedings, frequent STS and cue feedings.  Baby has fed 4 times for 15-20 minutes each and is holding baby who is now asleep. LC encouraged review of Baby and Me pp 9, 14 and 20-25 for STS and BF information. LC provided [facility] brochure and reviewed [facility] and list of community and web site resources.     Maternal Data Formula Feeding for Exclusion: No Infant to breast within first hour of birth: Yes (baby was sleepy at first attempt but able to nurse for 30 minutes (LATCH score=6)) Has patient been taught Hand Expression?: Yes (see RN, Karen's note at 2033; mom experienced breastfeeding mom but hx of sore nipples) Does the patient have breastfeeding experience prior to this delivery?: Yes  Feeding Feeding Type: Breast Fed Length of feed: 15 min  LATCH Score/Interventions Latch: Grasps breast easily, tongue down, lips flanged, rhythmical sucking. Intervention(s): Skin to skin;Teach feeding cues  Audible Swallowing: A few with stimulation Intervention(s): Skin to skin;Hand expression  Type of Nipple: Everted at rest and after stimulation  Comfort  (Breast/Nipple): Soft / non-tender     Hold (Positioning): No assistance needed to correctly position infant at breast.  LATCH Score: 9 (most recent feeding 1.5 hours ago as assessed by RN)  Lactation Tools Discussed/Used   STS, cue feedings, hand expression Nipple care and pre-pumping to assist with achieving deep latch Hand pump  Consult Status Consult Status: Follow-up Date: 01/27/14 Follow-up type: In-patient    [name] 01/26/2014, 10:04 PM

## 2014-01-26 NOTE — Progress Notes (Signed)
Patient ID: Rober MinionDiana R Hostetler, female   DOB: 05/17/1986, 28 y.o.   MRN: [id number]  Doing well.  AFVSS gen NAD FHTs 150, category 1 toco Q 2-4 min, irr  SVE 2.3/30/-2  AROM for clear fluid, w/o diff/comp  27yo [id number] at 39 for IOL Continue IOL, pitocin Pitocin to augment, wants to ambulate, etc Expect SVD

## 2014-01-26 NOTE — Progress Notes (Signed)
Patient ID: Rober MinionDiana R [place], female   DOB: 07/19/1986, 28 y.o.   MRN: [id number]   Getting more uncomfortable  AFVSS gen NAD FHTs 150's mod var, early decel toco Q 2-3 min  SVE 6/80 per RN  Continue current mgmt Expect SVD

## 2014-01-27 LAB — CBC
HEMATOCRIT: 29.7 % — AB (ref 36.0–46.0)
HEMOGLOBIN: 9.1 g/dL — AB (ref 12.0–15.0)
MCH: 21.9 pg — ABNORMAL LOW (ref 26.0–34.0)
MCHC: 30.6 g/dL (ref 30.0–36.0)
MCV: 71.6 fL — ABNORMAL LOW (ref 78.0–100.0)
Platelets: 307 10*3/uL (ref 150–400)
RBC: 4.15 MIL/uL (ref 3.87–5.11)
RDW: 15.3 % (ref 11.5–15.5)
WBC: 9.4 10*3/uL (ref 4.0–10.5)

## 2014-01-27 MED ORDER — OXYCODONE-ACETAMINOPHEN 5-325 MG PO TABS: 1.0000 | ORAL_TABLET | Freq: Four times a day (QID) | ORAL | Status: DC | PRN

## 2014-01-27 MED ORDER — PRENATAL MULTIVITAMIN CH: 1.0000 | ORAL_TABLET | Freq: Every day | ORAL | Status: DC

## 2014-01-27 MED ORDER — IBUPROFEN 800 MG PO TABS: 800.0000 mg | ORAL_TABLET | Freq: Three times a day (TID) | ORAL | Status: DC | PRN

## 2014-01-27 NOTE — Discharge Summary (Signed)
Obstetric Discharge Summary Reason for Admission: induction of labor Prenatal Procedures: none Intrapartum Procedures: spontaneous vaginal delivery Postpartum Procedures: none Complications-Operative and Postpartum: none Hemoglobin  Date Value Ref Range Status  01/27/2014 9.1* 12.0 - 15.0 g/dL Final     HCT  Date Value Ref Range Status  01/27/2014 29.7* 36.0 - 46.0 % Final    Physical Exam:  General: alert and no distress Lochia: appropriate Uterine Fundus: firm  Discharge Diagnoses: Term Pregnancy-delivered  Discharge Information: Date: 01/27/2014 Activity: pelvic rest Diet: routine Medications: PNV, Ibuprofen and Percocet Condition: stable Instructions: refer to practice specific booklet Discharge to: home Follow-up Information   Follow up with Everett, Luvern Mischke, MD. Schedule an appointment as soon as possible for a visit in 6 weeks. (for postpartum check)    Specialty:  Obstetrics and Gynecology   Contact information:   [address] [place] [id number] [phone]       Newborn Data: Live born female  Birth Weight: 6 lb 12 oz (3062 g) APGAR: 9, 9  Home with mother.  [name] 01/27/2014, 7:39 AM

## 2014-01-27 NOTE — Lactation Note (Signed)
This note was copied from the chart of Emily Stasia CavalierDiana Blust. Lactation Consultation Note: Mom reports that baby last fed 1 hour ago- he is asleep at her side. Reports that he is nursing well and she hears swallows, Asking about buying a NS if she needs one later. Encouraged to call and make OP appointment if she feels like she needs one so she can get the proper size and learn how to use it. No further questions at present. To call prn  Patient Name: Emily Stasia CavalierDiana Sees WUJWJ'XToday's Date: 01/27/2014 Reason for consult: Follow-up assessment   Maternal Data Formula Feeding for Exclusion: No Infant to breast within first hour of birth: Yes Does the patient have breastfeeding experience prior to this delivery?: Yes  Feeding Feeding Type: Breast Fed Length of feed: 25 min  LATCH Score/Interventions                      Lactation Tools Discussed/Used     Consult Status Consult Status: Complete    Pamelia HoitDonna D Sheilah Rayos 01/27/2014, 10:14 AM

## 2014-01-27 NOTE — Progress Notes (Signed)
UR chart review completed.  

## 2014-01-27 NOTE — Progress Notes (Signed)
Post Partum Day 1 Subjective: no complaints, up ad lib, voiding, tolerating PO and nl lochia, pain controlled, wants d/c today  Objective: Blood pressure 112/73, pulse 78, temperature 97.9 F (36.6 C), temperature source Oral, resp. rate 18, height 5\' 5"  (1.651 m), weight 86.183 kg (190 lb), last menstrual period 04/28/2013, unknown if currently breastfeeding.  Physical Exam:  General: alert and no distress Lochia: appropriate Uterine Fundus: firm   Recent Labs  01/26/14 0800 01/27/14 0610  HGB 9.6* 9.1*  HCT 30.8* 29.7*    Assessment/Plan: Discharge home, Breastfeeding and Lactation consult.  Routine care.  D/c with motrin, percocet and pnv.  F/u 6 weeks.     LOS: 1 day   Brennan Litzinger Bovard-Stuckert 01/27/2014, 7:33 AM

## 2014-07-17 ENCOUNTER — Encounter (HOSPITAL_COMMUNITY): Payer: Self-pay

## 2014-11-30 ENCOUNTER — Ambulatory Visit: Payer: No Typology Code available for payment source

## 2015-09-16 NOTE — L&D Delivery Note (Signed)
Delivery Note Pt progressed quickly to complete dilation and pushed well.  At 6:12 PM a healthy female was delivered via Vaginal, Spontaneous Delivery (Presentation:  Occiput Posterior).  APGAR: 9, 9; weight pending .   Placenta status: Intact, Spontaneous.  Cord: 3 vessels with the following complications: None. Anesthesia: None  Episiotomy: None Lacerations: 1st degree Suture Repair: 3.0 vicryl rapide Est. Blood Loss (mL): 125ml  Mom to postpartum.  Baby to Couplet care / Skin to Skin.  [name],[name] 03/22/2016, 6:31 PM

## 2015-09-26 ENCOUNTER — Inpatient Hospital Stay (HOSPITAL_COMMUNITY): Payer: Medicaid Other

## 2015-09-26 ENCOUNTER — Inpatient Hospital Stay (HOSPITAL_COMMUNITY)
Admission: AD | Admit: 2015-09-26 | Discharge: 2015-09-26 | Disposition: A | Discharge status: Home / Self Care | Payer: Medicaid Other | Source: Ambulatory Visit | Attending: Obstetrics and Gynecology | Admitting: Obstetrics and Gynecology

## 2015-09-26 ENCOUNTER — Encounter (HOSPITAL_COMMUNITY): Payer: Self-pay | Admitting: *Deleted

## 2015-09-26 DIAGNOSIS — O4691 Antepartum hemorrhage, unspecified, first trimester: Secondary | ICD-10-CM

## 2015-09-26 DIAGNOSIS — Z3491 Encounter for supervision of normal pregnancy, unspecified, first trimester: Secondary | ICD-10-CM

## 2015-09-26 DIAGNOSIS — O209 Hemorrhage in early pregnancy, unspecified: Secondary | ICD-10-CM

## 2015-09-26 DIAGNOSIS — Z3A00 Weeks of gestation of pregnancy not specified: Secondary | ICD-10-CM | POA: Diagnosis not present

## 2015-09-26 LAB — CBC WITH DIFFERENTIAL/PLATELET
BASOS ABS: 0 10*3/uL (ref 0.0–0.1)
Basophils Relative: 0 %
Eosinophils Absolute: 0.2 10*3/uL (ref 0.0–0.7)
Eosinophils Relative: 2 %
HEMATOCRIT: 32.6 % — AB (ref 36.0–46.0)
Hemoglobin: 10.3 g/dL — ABNORMAL LOW (ref 12.0–15.0)
LYMPHS ABS: 1.8 10*3/uL (ref 0.7–4.0)
LYMPHS PCT: 25 %
MCH: 23.3 pg — ABNORMAL LOW (ref 26.0–34.0)
MCHC: 31.6 g/dL (ref 30.0–36.0)
MCV: 73.8 fL — AB (ref 78.0–100.0)
MONO ABS: 0.3 10*3/uL (ref 0.1–1.0)
Monocytes Relative: 4 %
Neutro Abs: 4.9 10*3/uL (ref 1.7–7.7)
Neutrophils Relative %: 69 %
Platelets: 336 10*3/uL (ref 150–400)
RBC: 4.42 MIL/uL (ref 3.87–5.11)
RDW: 16.3 % — ABNORMAL HIGH (ref 11.5–15.5)
WBC: 7.1 10*3/uL (ref 4.0–10.5)

## 2015-09-26 LAB — WET PREP, GENITAL
Clue Cells Wet Prep HPF POC: NONE SEEN
SPERM: NONE SEEN
TRICH WET PREP: NONE SEEN
YEAST WET PREP: NONE SEEN

## 2015-09-26 LAB — URINE MICROSCOPIC-ADD ON

## 2015-09-26 LAB — POCT PREGNANCY, URINE: Preg Test, Ur: POSITIVE — AB

## 2015-09-26 LAB — HCG, QUANTITATIVE, PREGNANCY: hCG, Beta Chain, Quant, S: 49986 m[IU]/mL — ABNORMAL HIGH (ref <5)

## 2015-09-26 LAB — URINALYSIS, ROUTINE W REFLEX MICROSCOPIC
BILIRUBIN URINE: NEGATIVE
Glucose, UA: NEGATIVE mg/dL
KETONES UR: NEGATIVE mg/dL
LEUKOCYTES UA: NEGATIVE
NITRITE: NEGATIVE
PROTEIN: NEGATIVE mg/dL
Specific Gravity, Urine: >1.03 — ABNORMAL HIGH (ref 1.005–1.030)
pH: 6 (ref 5.0–8.0)

## 2015-09-26 LAB — ABO/RH: ABO/RH(D): O POS

## 2015-09-26 NOTE — MAU Provider Note (Signed)
History     CSN: [id number]  Arrival date and time: 09/26/15 1229   First Provider Initiated Contact with Patient 09/26/15 1310      Chief Complaint  Patient presents with  . Vaginal Bleeding  . Abdominal Cramping   HPI [name] 30 y.o. [id number] @Unknown  gestational age presents for spotting bright red blood and increased discharge.  Her discharge is mucus-like and brown.  It has been more off and on over a week or more.  She has had bright red spotting with wiping only.  This was first noticed yesterday.    She had a positive home pregnancy test one week ago and has her first appt for OB care this pregnancy tomorrow.  She denies nausea, vomiting, abdominal pain, dysuria, fever, weakness.   No sex in last week.   OB History    Gravida Para Term Preterm AB TAB SAB Ectopic Multiple Living   4 4 4       4       Past Medical History  Diagnosis Date  . Medical history non-contributory   . Normal pregnancy, repeat 01/25/2014  . SVD (spontaneous vaginal delivery) 01/26/2014    Past Surgical History  Procedure Laterality Date  . Wisdom tooth extraction      Family History  Problem Relation Age of Onset  . Alcohol abuse Neg Hx   . Arthritis Neg Hx   . Asthma Neg Hx   . Birth defects Neg Hx   . Cancer Neg Hx   . COPD Neg Hx   . Depression Neg Hx   . Diabetes Neg Hx   . Drug abuse Neg Hx   . Early death Neg Hx   . Hearing loss Neg Hx   . Heart disease Neg Hx   . Hyperlipidemia Neg Hx   . Hypertension Neg Hx   . Kidney disease Neg Hx   . Learning disabilities Neg Hx   . Mental illness Neg Hx   . Mental retardation Neg Hx   . Miscarriages / Stillbirths Neg Hx   . Stroke Neg Hx   . Vision loss Neg Hx   . Varicose Veins Neg Hx     Social History  Substance Use Topics  . Smoking status: Never Smoker   . Smokeless tobacco: Never Used  . Alcohol Use: No    Allergies: No Known Allergies  Prescriptions prior to admission  Medication Sig Dispense Refill Last Dose   . ibuprofen (ADVIL,MOTRIN) 800 MG tablet Take 1 tablet (800 mg total) by mouth every 8 (eight) hours as needed. 45 tablet 1   . oxyCODONE-acetaminophen (PERCOCET/ROXICET) 5-325 MG per tablet Take 1-2 tablets by mouth every 6 (six) hours as needed for severe pain (moderate - severe pain). 15 tablet 0   . Prenatal Vit-Fe Fumarate-FA (PRENATAL MULTIVITAMIN) TABS tablet Take 1 tablet by mouth at bedtime. 30 tablet 12     ROS Pertinent ROS in HPI.  All other systems are negative.   Physical Exam   Blood pressure 124/66, pulse 87, temperature 98.2 F (36.8 C), temperature source Oral, resp. rate 18, height 5' 5.5" (1.664 m), weight 204 lb 3.2 oz (92.625 kg), last menstrual period 07/21/2015, unknown if currently breastfeeding.  Physical Exam  Constitutional: She is oriented to person, place, and time. She appears well-developed and well-nourished. No distress.  HENT:  Head: Normocephalic and atraumatic.  Eyes: Conjunctivae and EOM are normal.  Neck: Normal range of motion. Neck supple.  Cardiovascular: Normal rate and normal  heart sounds.   Respiratory: Effort normal and breath sounds normal. No respiratory distress.  GI: Soft. Bowel sounds are normal. She exhibits no distension. There is no tenderness.  Genitourinary: Vagina normal.  Mucus, dark tan colored discharge and tiny amt of blood noted in vault.  No active bleeding from cervix.   Cervix is closed.  No CMT.  No adnexal mass or tenderness appreciated.   Musculoskeletal: Normal range of motion.  Neurological: She is alert and oriented to person, place, and time.  Skin: Skin is warm and dry.  Psychiatric: She has a normal mood and affect. Her behavior is normal.    MAU Course  Procedures  MDM Ectopic workup ordered to eval reported bleeding in early pregnancy Tiny amt of blood noted on pelvic Rh + and therefore no concern for Rh incompatibility Wet prep,U/A, CBC without any indication for infection or anemia.  U/S with IUP and  no subchorionic Discussed with Dr. Jackelyn Knife.  He is in agreement for discharge of patient.  She already has PNV and has clinic appt for tomorrow. Assessment and Plan  A:  1. Normal IUP (intrauterine pregnancy) on prenatal ultrasound, first trimester   2. Vaginal bleeding in pregnancy, first trimester      P: Discharge to home Continue PNV Begin [facility] tomorrow Pelvic rest for now Patient may return to MAU as needed or if her condition were to change or worsen   Bertram Denver 09/26/2015, 1:10 PM

## 2015-09-26 NOTE — Discharge Instructions (Signed)
Primer trimestre de embarazo  (First Trimester of Pregnancy)  El primer trimestre de embarazo se extiende desde la semana 1 hasta el final de la semana 12 (mes 1 al mes 3). Una semana después de que un espermatozoide fecunda un óvulo, este se implantará en la pared uterina. Este embrión comenzará a desarrollarse hasta convertirse en un bebé. Sus genes y los de su pareja forman el bebé. Los genes del varón determinan si será un niño o una niña. Entre la semana 6 y la 8, se forman los ojos y el rostro, y los latidos del corazón pueden verse en la ecografía. Al final de las 12 semanas, todos los órganos del bebé están formados.   Ahora que está embarazada, querrá hacer todo lo que esté a su alcance para tener un bebé sano. Dos de las cosas más importantes son tener una buena atención prenatal y seguir las indicaciones del médico. La atención prenatal incluye toda la asistencia médica que usted recibe antes del nacimiento del bebé. Esta ayudará a prevenir, detectar y tratar cualquier problema durante el embarazo y el parto.  CAMBIOS EN EL ORGANISMO  Su organismo atraviesa por muchos cambios durante el embarazo, y estos varían de una mujer a otra.   · Al principio, puede aumentar o bajar algunos kilos.  · Puede tener malestar estomacal (náuseas) y vomitar. Si no puede controlar los vómitos, llame al médico.  · Puede cansarse con facilidad.  · Es posible que tenga dolores de cabeza que pueden aliviarse con los medicamentos que el médico le permita tomar.  · Puede orinar con mayor frecuencia. El dolor al orinar puede significar que usted tiene una infección de la vejiga.  · Debido al embarazo, puede tener acidez estomacal.  · Puede estar estreñida, ya que ciertas hormonas enlentecen los movimientos de los músculos que empujan los desechos a través de los intestinos.  · Pueden aparecer hemorroides o abultarse e hincharse las venas (venas varicosas).  · Las mamas pueden empezar a agrandarse y estar sensibles. Los pezones  pueden sobresalir más, y el tejido que los rodea (areola) tornarse más oscuro.  · Las encías pueden sangrar y estar sensibles al cepillado y al hilo dental.  · Pueden aparecer zonas oscuras o manchas (cloasma, máscara del embarazo) en el rostro que probablemente se atenuarán después del nacimiento del bebé.  · Los períodos menstruales se interrumpirán.  · Tal vez no tenga apetito.  · Puede sentir un fuerte deseo de consumir ciertos alimentos.  · Puede tener cambios a nivel emocional día a día, por ejemplo, por momentos puede estar emocionada por el embarazo y por otros preocuparse porque algo pueda salir mal con el embarazo o el bebé.  · Tendrá sueños más vívidos y extraños.  · Tal vez haya cambios en el cabello que pueden incluir su engrosamiento, crecimiento rápido y cambios en la textura. A algunas mujeres también se les cae el cabello durante o después del embarazo, o tienen el cabello seco o fino. Lo más probable es que el cabello se le normalice después del nacimiento del bebé.  QUÉ DEBE ESPERAR EN LAS CONSULTAS PRENATALES  Durante una visita prenatal de rutina:  · La pesarán para asegurarse de que usted y el bebé están creciendo normalmente.  · Le controlarán la presión arterial.  · Le medirán el abdomen para controlar el desarrollo del bebé.  · Se escucharán los latidos cardíacos a partir de la semana 10 o la 12 de embarazo, aproximadamente.  · Se analizarán los resultados de los estudios solicitados en visitas anteriores.  El   médico puede preguntarle:  · Cómo se siente.  · Si siente los movimientos del bebé.  · Si ha tenido síntomas anormales, como pérdida de líquido, sangrado, dolores de cabeza intensos o cólicos abdominales.  · Si está consumiendo algún producto que contenga tabaco, como cigarrillos, tabaco de mascar y cigarrillos electrónicos.  · Si tiene alguna pregunta.  Otros estudios que pueden realizarse durante el primer trimestre incluyen lo siguiente:  · Análisis de sangre para determinar el tipo  de sangre y detectar la presencia de infecciones previas. Además, se los usará para controlar si los niveles de hierro son bajos (anemia) y determinar los anticuerpos Rh. En una etapa más avanzada del embarazo, se harán análisis de sangre para saber si tiene diabetes, junto con otros estudios si surgen problemas.  · Análisis de orina para detectar infecciones, diabetes o proteínas en la orina.  · Una ecografía para confirmar que el bebé crece y se desarrolla correctamente.  · Una amniocentesis para diagnosticar posibles problemas genéticos.  · Estudios del feto para descartar espina bífida y síndrome de Down.  · Es posible que necesite otras pruebas adicionales.  · Prueba del VIH (virus de inmunodeficiencia humana). Los exámenes prenatales de rutina incluyen la prueba de detección del VIH, a menos que decida no realizársela.  INSTRUCCIONES PARA EL CUIDADO EN EL HOGAR   Medicamentos:  · Siga las indicaciones del médico en relación con el uso de medicamentos. Durante el embarazo, hay medicamentos que pueden tomarse y otros que no.  · Tome las vitaminas prenatales como se le indicó.  · Si está estreñida, tome un laxante suave, si el médico lo autoriza.  Dieta  · Consuma alimentos balanceados. Elija alimentos variados, como carne o proteínas de origen vegetal, pescado, leche y productos lácteos descremados, verduras, frutas y panes y cereales integrales. El médico la ayudará a determinar la cantidad de peso que puede aumentar.  · No coma carne cruda ni quesos sin cocinar. Estos elementos contienen bacterias que pueden causar defectos congénitos en el bebé.  · La ingesta diaria de cuatro o cinco comidas pequeñas en lugar de tres comidas abundantes puede ayudar a aliviar las náuseas y los vómitos. Si empieza a tener náuseas, comer algunas galletas saladas puede ser de ayuda. Beber líquidos entre las comidas en lugar de tomarlos durante las comidas también puede ayudar a calmar las náuseas y los vómitos.  · Si está  estreñida, consuma alimentos con alto contenido de fibra, como verduras y frutas frescas, y cereales integrales. Beba suficiente líquido para mantener la orina clara o de color amarillo pálido.  Actividad y ejercicios  · Haga ejercicio solamente como se lo haya indicado el médico. El ejercicio la ayudará a:    Controlar el peso.    Mantenerse en forma.    Estar preparada para el trabajo de parto y el parto.  · Los dolores, los cólicos en la parte baja del abdomen o los calambres en la cintura son un buen indicio de que debe dejar de hacer ejercicios. Consulte al médico antes de seguir haciendo ejercicios normales.  · Intente no estar de pie durante mucho tiempo. Mueva las piernas con frecuencia si debe estar de pie en un lugar durante mucho tiempo.  · Evite levantar pesos excesivos.  · Use zapatos de tacones bajos y mantenga una buena postura.  · Puede seguir teniendo relaciones sexuales, excepto que el médico le indique lo contrario.  Alivio del dolor o las molestias  · Use un sostén que le brinde buen   soporte si siente dolor a la palpación en las mamas.  · Dese baños de asiento con agua tibia para aliviar el dolor o las molestias causadas por las hemorroides. Use crema antihemorroidal si el médico se lo permite.  · Descanse con las piernas elevadas si tiene calambres o dolor de cintura.  · Si tiene venas varicosas en las piernas, use medias de descanso. Eleve los pies durante 15 minutos, 3 o 4 veces por día. Limite la cantidad de sal en su dieta.  Cuidados prenatales  · Programe las visitas prenatales para la semana 12 de embarazo. Generalmente se programan cada mes al principio y se hacen más frecuentes en los 2 últimos meses antes del parto.  · Escriba sus preguntas. Llévelas cuando concurra a las visitas prenatales.  · Concurra a todas las visitas prenatales como se lo haya indicado el médico.  Seguridad  · Colóquese el cinturón de seguridad cuando conduzca.  · Haga una lista de los números de teléfono de  emergencia, que incluya los números de teléfono de familiares, amigos, el hospital y los departamentos de policía y bomberos.  Consejos generales  · Pídale al médico que la derive a clases de educación prenatal en su localidad. Debe comenzar a tomar las clases antes de entrar en el mes 6 de embarazo.  · Pida ayuda si tiene necesidades nutricionales o de asesoramiento durante el embarazo. El médico puede aconsejarla o derivarla a especialistas para que la ayuden con diferentes necesidades.  · No se dé baños de inmersión en agua caliente, baños turcos ni saunas.  · No se haga duchas vaginales ni use tampones o toallas higiénicas perfumadas.  · No mantenga las piernas cruzadas durante mucho tiempo.  · Evite el contacto con las bandejas sanitarias de los gatos y la tierra que estos animales usan. Estos elementos contienen bacterias que pueden causar defectos congénitos al bebé y la posible pérdida del feto debido a un aborto espontáneo o muerte fetal.  · No fume, no consuma hierbas ni medicamentos que no hayan sido recetados por el médico. Las sustancias químicas que estos productos contienen afectan la formación y el desarrollo del bebé.  · No consuma ningún producto que contenga tabaco, lo que incluye cigarrillos, tabaco de mascar y cigarrillos electrónicos. Si necesita ayuda para dejar de fumar, consulte al médico. Puede recibir asesoramiento y otro tipo de recursos para dejar de fumar.  · Programe una cita con el dentista. En su casa, lávese los dientes con un cepillo dental blando y pásese el hilo dental con suavidad.  SOLICITE ATENCIÓN MÉDICA SI:   · Tiene mareos.  · Siente cólicos leves, presión en la pelvis o dolor persistente en el abdomen.  · Tiene náuseas, vómitos o diarrea persistentes.  · Tiene secreción vaginal con mal olor.  · Siente dolor al orinar.  · Tiene el rostro, las manos, las piernas o los tobillos más hinchados.  SOLICITE ATENCIÓN MÉDICA DE INMEDIATO SI:   · Tiene fiebre.  · Tiene una pérdida de  líquido por la vagina.  · Tiene sangrado o pequeñas pérdidas vaginales.  · Siente dolor intenso o cólicos en el abdomen.  · Sube o baja de peso rápidamente.  · Vomita sangre de color rojo brillante o material que parezca granos de café.  · Ha estado expuesta a la rubéola y no ha sufrido la enfermedad.  · Ha estado expuesta a la quinta enfermedad o a la varicela.  · Tiene un dolor de cabeza intenso.  · Le falta el aire.  · Sufre   cualquier tipo de traumatismo, por ejemplo, debido a una caída o un accidente automovilístico.     Esta información no tiene como fin reemplazar el consejo del médico. Asegúrese de hacerle al médico cualquier pregunta que tenga.     Document Released: 06/11/2005 Document Revised: 09/22/2014  Elsevier Interactive Patient Education ©2016 [employer]

## 2015-09-26 NOTE — MAU Note (Signed)
Went to HD last Friday,  +preg test,  8 or 9 wks by LMP.  Has been spotting and brown d/c off and on .  Has first appt tomorrow. Called office, instructed to come here.. Some cramping mainly on left side

## 2015-09-27 LAB — HIV ANTIBODY (ROUTINE TESTING W REFLEX): HIV SCREEN 4TH GENERATION: NONREACTIVE

## 2015-09-27 LAB — RPR: RPR: NONREACTIVE

## 2015-09-27 LAB — GC/CHLAMYDIA PROBE AMP (~~LOC~~) NOT AT ARMC
CHLAMYDIA, DNA PROBE: NEGATIVE
Neisseria Gonorrhea: NEGATIVE

## 2016-03-13 LAB — OB RESULTS CONSOLE GBS: STREP GROUP B AG: NEGATIVE

## 2016-03-22 ENCOUNTER — Inpatient Hospital Stay (HOSPITAL_COMMUNITY)
Admission: AD | Admit: 2016-03-22 | Discharge: 2016-03-24 | DRG: 775 | Disposition: A | Discharge status: Home / Self Care | Payer: Medicaid Other | Source: Ambulatory Visit | Attending: Obstetrics and Gynecology | Admitting: Obstetrics and Gynecology

## 2016-03-22 ENCOUNTER — Encounter (HOSPITAL_COMMUNITY): Payer: Self-pay | Admitting: Certified Nurse Midwife

## 2016-03-22 DIAGNOSIS — O4292 Full-term premature rupture of membranes, unspecified as to length of time between rupture and onset of labor: Principal | ICD-10-CM | POA: Diagnosis present

## 2016-03-22 DIAGNOSIS — Z3A37 37 weeks gestation of pregnancy: Secondary | ICD-10-CM | POA: Diagnosis not present

## 2016-03-22 DIAGNOSIS — O4290 Premature rupture of membranes, unspecified as to length of time between rupture and onset of labor, unspecified weeks of gestation: Secondary | ICD-10-CM | POA: Diagnosis present

## 2016-03-22 LAB — CBC
HCT: 32.6 % — ABNORMAL LOW (ref 36.0–46.0)
HEMOGLOBIN: 10.4 g/dL — AB (ref 12.0–15.0)
MCH: 22.8 pg — AB (ref 26.0–34.0)
MCHC: 31.9 g/dL (ref 30.0–36.0)
MCV: 71.3 fL — ABNORMAL LOW (ref 78.0–100.0)
PLATELETS: 354 10*3/uL (ref 150–400)
RBC: 4.57 MIL/uL (ref 3.87–5.11)
RDW: 16.5 % — ABNORMAL HIGH (ref 11.5–15.5)
WBC: 8.7 10*3/uL (ref 4.0–10.5)

## 2016-03-22 LAB — TYPE AND SCREEN
ABO/RH(D): O POS
ANTIBODY SCREEN: NEGATIVE

## 2016-03-22 MED ORDER — SIMETHICONE 80 MG PO CHEW: 80.0000 mg | CHEWABLE_TABLET | ORAL | Status: DC | PRN

## 2016-03-22 MED ORDER — LACTATED RINGERS IV SOLN: 500.0000 mL | INTRAVENOUS | Status: DC | PRN

## 2016-03-22 MED ORDER — ONDANSETRON HCL 4 MG/2ML IJ SOLN: 4.0000 mg | Freq: Four times a day (QID) | INTRAMUSCULAR | Status: DC | PRN

## 2016-03-22 MED ORDER — OXYTOCIN 40 UNITS IN LACTATED RINGERS INFUSION - SIMPLE MED: 2.5000 [IU]/h | INTRAVENOUS | Status: DC

## 2016-03-22 MED ORDER — ONDANSETRON HCL 4 MG PO TABS: 4.0000 mg | ORAL_TABLET | ORAL | Status: DC | PRN

## 2016-03-22 MED ORDER — TETANUS-DIPHTH-ACELL PERTUSSIS 5-2.5-18.5 LF-MCG/0.5 IM SUSP: 0.5000 mL | Freq: Once | INTRAMUSCULAR | Status: DC

## 2016-03-22 MED ORDER — COCONUT OIL OIL: 1.0000 "application " | TOPICAL_OIL | Status: DC | PRN

## 2016-03-22 MED ORDER — OXYCODONE-ACETAMINOPHEN 5-325 MG PO TABS: 1.0000 | ORAL_TABLET | ORAL | Status: DC | PRN

## 2016-03-22 MED ORDER — WITCH HAZEL-GLYCERIN EX PADS
1.0000 | MEDICATED_PAD | CUTANEOUS | Status: DC | PRN
Start: 2016-03-22 — End: 2016-03-24

## 2016-03-22 MED ORDER — PRENATAL MULTIVITAMIN CH
1.0000 | ORAL_TABLET | Freq: Every day | ORAL | Status: DC
  Administered 2016-03-23 – 2016-03-24 (×2): 1 via ORAL
  Filled 2016-03-22 (×2): qty 1

## 2016-03-22 MED ORDER — OXYTOCIN BOLUS FROM INFUSION: 500.0000 mL | INTRAVENOUS | Status: DC

## 2016-03-22 MED ORDER — SOD CITRATE-CITRIC ACID 500-334 MG/5ML PO SOLN: 30.0000 mL | ORAL | Status: DC | PRN

## 2016-03-22 MED ORDER — SENNOSIDES-DOCUSATE SODIUM 8.6-50 MG PO TABS
2.0000 | ORAL_TABLET | ORAL | Status: DC
  Administered 2016-03-23 (×2): 2 via ORAL
  Filled 2016-03-22 (×2): qty 2

## 2016-03-22 MED ORDER — DIPHENHYDRAMINE HCL 25 MG PO CAPS: 25.0000 mg | ORAL_CAPSULE | Freq: Four times a day (QID) | ORAL | Status: DC | PRN

## 2016-03-22 MED ORDER — ZOLPIDEM TARTRATE 5 MG PO TABS: 5.0000 mg | ORAL_TABLET | Freq: Every evening | ORAL | Status: DC | PRN

## 2016-03-22 MED ORDER — BUTORPHANOL TARTRATE 1 MG/ML IJ SOLN: 1.0000 mg | INTRAMUSCULAR | Status: DC | PRN

## 2016-03-22 MED ORDER — ONDANSETRON HCL 4 MG/2ML IJ SOLN: 4.0000 mg | INTRAMUSCULAR | Status: DC | PRN

## 2016-03-22 MED ORDER — LIDOCAINE HCL (PF) 1 % IJ SOLN
30.0000 mL | INTRAMUSCULAR | Status: DC | PRN
  Administered 2016-03-22: 30 mL via SUBCUTANEOUS
  Filled 2016-03-22: qty 30

## 2016-03-22 MED ORDER — TERBUTALINE SULFATE 1 MG/ML IJ SOLN: 0.2500 mg | Freq: Once | INTRAMUSCULAR | Status: DC | PRN

## 2016-03-22 MED ORDER — OXYCODONE HCL 5 MG PO TABS
10.0000 mg | ORAL_TABLET | ORAL | Status: DC | PRN
  Administered 2016-03-24 (×2): 10 mg via ORAL
  Filled 2016-03-22 (×2): qty 2

## 2016-03-22 MED ORDER — OXYCODONE HCL 5 MG PO TABS
5.0000 mg | ORAL_TABLET | ORAL | Status: DC | PRN
  Administered 2016-03-23 (×2): 5 mg via ORAL
  Filled 2016-03-22 (×2): qty 1

## 2016-03-22 MED ORDER — BENZOCAINE-MENTHOL 20-0.5 % EX AERO
1.0000 "application " | INHALATION_SPRAY | CUTANEOUS | Status: DC | PRN
  Administered 2016-03-23: 1 via TOPICAL
  Filled 2016-03-22: qty 56

## 2016-03-22 MED ORDER — OXYTOCIN 40 UNITS IN LACTATED RINGERS INFUSION - SIMPLE MED
1.0000 m[IU]/min | INTRAVENOUS | Status: DC
Start: 2016-03-22 — End: 2016-03-22
  Administered 2016-03-22: 2 m[IU]/min via INTRAVENOUS
  Filled 2016-03-22: qty 1000

## 2016-03-22 MED ORDER — IBUPROFEN 600 MG PO TABS
600.0000 mg | ORAL_TABLET | Freq: Four times a day (QID) | ORAL | Status: DC
  Administered 2016-03-22 – 2016-03-24 (×8): 600 mg via ORAL
  Filled 2016-03-22 (×8): qty 1

## 2016-03-22 MED ORDER — DIBUCAINE 1 % RE OINT: 1.0000 "application " | TOPICAL_OINTMENT | RECTAL | Status: DC | PRN

## 2016-03-22 MED ORDER — ACETAMINOPHEN 325 MG PO TABS: 650.0000 mg | ORAL_TABLET | ORAL | Status: DC | PRN

## 2016-03-22 MED ORDER — LACTATED RINGERS IV SOLN
INTRAVENOUS | Status: DC
  Administered 2016-03-22: 15:00:00 via INTRAVENOUS

## 2016-03-22 MED ORDER — OXYCODONE-ACETAMINOPHEN 5-325 MG PO TABS: 2.0000 | ORAL_TABLET | ORAL | Status: DC | PRN

## 2016-03-22 MED ORDER — ACETAMINOPHEN 325 MG PO TABS
650.0000 mg | ORAL_TABLET | ORAL | Status: DC | PRN
  Administered 2016-03-23 – 2016-03-24 (×2): 650 mg via ORAL
  Filled 2016-03-22 (×2): qty 2

## 2016-03-22 NOTE — Progress Notes (Signed)
Patient ID: [name], female   DOB: 11/03/1985, 30 y.o.   MRN: [id number] Pt just now making it into L&D room.  Will begin pitocin as still feeling minimal contractions  afeb vss Cervix 60/2-3/-2 Vertex  Begin pitocin Does not plan epidural, maybe IV pain meds Follow progress closely, h/o rapid labors.

## 2016-03-22 NOTE — H&P (Signed)
[name] is a 30 y.o. female G5P4004 at 37+ weeks (EDD 04/11/16 by 11 week US and unsure LMP) presenting for SROM and irregular mild ctx.  Prenatal care uncomplicated except a history of precipitous deliveries.  Declines pp BTL after initially considering.    Maternal Medical History:  Reason for admission: Rupture of membranes.   Contractions: Onset was 3-5 hours ago.   Frequency: irregular.   Perceived severity is mild.    Fetal activity: Perceived fetal activity is normal.    Prenatal Complications - Diabetes: none.    OB History    Gravida Para Term Preterm AB TAB SAB Ectopic Multiple Living   5 4 4       4     2007 NSVD 7#8oz 2008 NSVD 6#4oz 2012 NSVD 6#13oz 2015 NSVD 6#12oz  Past Medical History  Diagnosis Date  . Medical history non-contributory   . Normal pregnancy, repeat 01/25/2014  . SVD (spontaneous vaginal delivery) 01/26/2014   Past Surgical History  Procedure Laterality Date  . Wisdom tooth extraction     Family History: family history is negative for Alcohol abuse, Arthritis, Asthma, Birth defects, Cancer, COPD, Depression, Diabetes, Drug abuse, Early death, Hearing loss, Heart disease, Hyperlipidemia, Hypertension, Kidney disease, Learning disabilities, Mental illness, Mental retardation, Miscarriages / Stillbirths, Stroke, Vision loss, and Varicose Veins. Social History:  reports that she has never smoked. She has never used smokeless tobacco. She reports that she does not drink alcohol or use illicit drugs.   Prenatal Transfer Tool  Maternal Diabetes: No Genetic Screening: Normal Maternal Ultrasounds/Referrals: Normal Fetal Ultrasounds or other Referrals:  None Maternal Substance Abuse:  No Significant Maternal Medications:  None Significant Maternal Lab Results:  None Other Comments:  None  Review of Systems  Gastrointestinal: Negative for abdominal pain.    Dilation: 2.5 Effacement (%): 60 Station: Ballotable Exam by:: A. Yetta BarreJones RN Blood  pressure 131/76, pulse 109, temperature 98.3 F (36.8 C), temperature source Oral, resp. rate 18, last menstrual period 07/21/2015, unknown if currently breastfeeding. Maternal Exam:  Uterine Assessment: Contraction strength is mild.  Contraction frequency is irregular.   Abdomen: Fetal presentation: vertex  Introitus: Normal vulva. Normal vagina.    Physical Exam  Constitutional: She appears well-developed.  Cardiovascular: Normal rate and regular rhythm.   Respiratory: Effort normal.  GI: Soft.  Genitourinary: Vagina normal.  Neurological: She is alert.  Psychiatric: She has a normal mood and affect.    Prenatal labs: ABO, Rh: --/--/O POS (01/11 1325) Antibody:  negative Rubella:  immune RPR: Non Reactive (01/11 1325)  HBsAg:   Neg HIV: Non Reactive (01/11 1325)  GBS:   Neg One hour GCT 71 First trimester screen WNL   Assessment/Plan: Pt to be admitted when L&D bed available and augmented with pitocin.  Epidural if desires.   Oliver PilaICHARDSON,[name] 03/22/2016, 11:14 AM

## 2016-03-22 NOTE — MAU Note (Signed)
Pt up to walk for 45 min-1 hour.

## 2016-03-22 NOTE — MAU Note (Signed)
Pt states she had several large gushes of fluid this AM and pad on admission is soaked. Fetus active. Occasional ctxs and denies vaginal bleeding.

## 2016-03-23 ENCOUNTER — Encounter (HOSPITAL_COMMUNITY): Payer: Self-pay | Admitting: *Deleted

## 2016-03-23 LAB — CBC
HEMATOCRIT: 31.6 % — AB (ref 36.0–46.0)
Hemoglobin: 9.9 g/dL — ABNORMAL LOW (ref 12.0–15.0)
MCH: 22.4 pg — ABNORMAL LOW (ref 26.0–34.0)
MCHC: 31.3 g/dL (ref 30.0–36.0)
MCV: 71.5 fL — AB (ref 78.0–100.0)
Platelets: 326 10*3/uL (ref 150–400)
RBC: 4.42 MIL/uL (ref 3.87–5.11)
RDW: 16.6 % — AB (ref 11.5–15.5)
WBC: 9.8 10*3/uL (ref 4.0–10.5)

## 2016-03-23 LAB — RPR: RPR: NONREACTIVE

## 2016-03-23 NOTE — Lactation Note (Signed)
This note was copied from a baby's chart. Lactation Consultation Note  Patient Name: Emily Stasia CavalierDiana Melman RUEAV'WToday's Date: 03/23/2016 Reason for consult: Initial assessment   Initial visit with 16 hrs old baby born at 9316w1d.  This is Mom's 5th baby.  She BF all 4, but stated her milk "went away" at 3 months with each.  Mom started on oral BCP at 6 weeks.  Talked about possibly this causing her milk supply to decrease, or it could have been a growth spurt and instead of BF more often, if she gave formula to baby, her supply could drop.  Mom to speak to her OB about a different form of BC if she would like to BF longer. This baby is very fussy, and Mom asking for formula.  Offered to assist with latch, and reassure Mom that baby is transferring her milk.  Talked about early term infants mimicking late preterm babies (LPTI handout given).  Set up DEBP, with instructions to pump after breast feeding on initiation setting, and offer any colostrum she expressed (curved tip syringe in room).  Mom to call her RN for assistance with this.  To awaken baby for feedings every 3 hrs, and if baby doesn't feed, that is when we would offer formula supplementation (Alimentum in room).   Right now baby is aggressive and feeding often.  [facility]  Showed Mom how to use alternate breast compression to increase milk transfer. Mom seems okay with plan right now, RN aware.  Encouraged skin to skin, and placing baby at breast at least every 3 hrs.  Brochure left in room.  Informed Mom of IP and OP Lactation services available to her.  To call for help prn, and LC to f/u in am.    Consult Status Consult Status: Follow-up Date: 03/24/16 Follow-up type: In-patient    Judee ClaraSmith, Malijah Lietz E 03/23/2016, 10:40 AM

## 2016-03-23 NOTE — Progress Notes (Addendum)
Post Partum Day 1 Subjective: no complaints and tolerating PO  Baby nursing well   Objective: Blood pressure 126/65, pulse 88, temperature 98.9 F (37.2 C), temperature source Oral, resp. rate 18, height 5\' 5"  (1.651 m), weight 214 lb (97.07 kg), last menstrual period 07/21/2015, SpO2 100 %, unknown if currently breastfeeding.  Physical Exam:  General: alert and cooperative Lochia: appropriate Uterine Fundus: firm    Recent Labs  03/22/16 1350 03/23/16 0519  HGB 10.4* 9.9*  HCT 32.6* 31.6*    Assessment/Plan: Plan for discharge tomorrow  Pt declines circumcision Husband planning vasectomy   LOS: 1 day   Tyson Parkison W 03/23/2016, 10:03 AM

## 2016-03-24 MED ORDER — OXYCODONE HCL 5 MG PO TABS: ORAL_TABLET | ORAL | Status: AC

## 2016-03-24 MED ORDER — PRENATAL MULTIVITAMIN CH: 1.0000 | ORAL_TABLET | Freq: Every day | ORAL | Status: AC

## 2016-03-24 MED ORDER — IBUPROFEN 800 MG PO TABS: 800.0000 mg | ORAL_TABLET | Freq: Three times a day (TID) | ORAL | Status: DC | PRN

## 2016-03-24 NOTE — Progress Notes (Addendum)
Post Partum Day 2 Subjective: no complaints, up ad lib, voiding, tolerating PO and nl lochia, pain controlled  Objective: Blood pressure 102/62, pulse 87, temperature 97.8 F (36.6 C), temperature source Oral, resp. rate 20, height 5\' 5"  (1.651 m), weight 97.07 kg (214 lb), last menstrual period 07/21/2015, SpO2 100 %, unknown if currently breastfeeding.  Physical Exam:  General: alert and no distress Lochia: appropriate Uterine Fundus: firm   Recent Labs  03/22/16 1350 03/23/16 0519  HGB 10.4* 9.9*  HCT 32.6* 31.6*    Assessment/Plan: Discharge home, Breastfeeding and Lactation consult.  D/c with motrin and PNV, f/u 6 weeks.     LOS: 2 days   Bovard-Stuckert, Evoleht Hovatter 03/24/2016, 8:33 AM   Pt requests percocet, will give #15 at d/c.

## 2016-03-24 NOTE — Lactation Note (Signed)
This note was copied from a baby's chart. Lactation Consultation Note: Experienced BF mom reports baby is latching well. Reports her milk is not in yet and she is giving some formula after nursing. Encouraged to always breast feed first then offer formula if baby is still hungry. Baby asleep at mom's side, just finished feeding. Has manual pump in room. Reviewed use - mom states she used it with 28 y/o. No questions at present. To call prn  Patient Name: Emily Stasia CavalierDiana Ferrin MVHQI'OToday's Date: 03/24/2016 Reason for consult: Follow-up assessment   Maternal Data Formula Feeding for Exclusion: No Has patient been taught Hand Expression?: Yes Does the patient have breastfeeding experience prior to this delivery?: Yes  Feeding    LATCH Score/Interventions                      Lactation Tools Discussed/Used     Consult Status Consult Status: Complete    Pamelia HoitWeeks, Vaughan Garfinkle D 03/24/2016, 10:53 AM

## 2016-03-24 NOTE — Discharge Summary (Signed)
OB Discharge Summary     Patient Name: Emily MinionDiana R Baby DOB: 10/22/1985 MRN: [id number]  Date of admission: 03/22/2016 Delivering MD: Huel CoteICHARDSON, KATHY   Date of discharge: 03/24/2016  Admitting diagnosis: 37 WEEKS - PLUS 1 DAY - WATER BROKE, SMALL CONTRACTIONS Intrauterine pregnancy: 501w1d     Secondary diagnosis:  Active Problems:   PROM (premature rupture of membranes)   NSVD (normal spontaneous vaginal delivery)  Additional problems: N/A     Discharge diagnosis: Term Pregnancy Delivered                                                                                                Post partum procedures:N/A  Augmentation: Pitocin  Complications: None  Hospital course:  Onset of Labor With Vaginal Delivery     30 y.o. yo G5P5005 at 321w1d was admitted in Latent Labor on 03/22/2016. Patient had an uncomplicated labor course as follows:  Membrane Rupture Time/Date: 8:00 AM ,03/22/2016   Intrapartum Procedures: Episiotomy: None [1]                                         Lacerations:  1st degree [2]  Patient had a delivery of a Viable infant. 03/22/2016  Information for the patient's newborn:  Regan LemmingBriones, Boy Joell [[id number]][[id number]]  Delivery Method: Vaginal, Spontaneous Delivery (Filed from Delivery Summary)    Pateint had an uncomplicated postpartum course.  She is ambulating, tolerating a regular diet, passing flatus, and urinating well. Patient is discharged home in stable condition on 03/24/2016.    Physical exam  Filed Vitals:   03/22/16 2125 03/23/16 0041 03/23/16 1727 03/24/16 0605  BP: 126/65  109/61 102/62  Pulse: 88  92 87  Temp: 98.7 F (37.1 C) 98.9 F (37.2 C) 98.2 F (36.8 C) 97.8 F (36.6 C)  TempSrc: Oral  Oral Oral  Resp: 18  14 20   Height:      Weight:      SpO2: 100%  100% 100%   General: alert and cooperative Lochia: appropriate Uterine Fundus: firm  Labs: Lab Results  Component Value Date   WBC 9.8 03/23/2016   HGB 9.9* 03/23/2016   HCT 31.6*  03/23/2016   MCV 71.5* 03/23/2016   PLT 326 03/23/2016   CMP Latest Ref Rng 08/27/2009  Glucose 70 - 99 mg/dL 98  BUN 6 - 23 mg/dL 8  Creatinine 0.4 - 1.2 mg/dL .6  Sodium 135 - 145 mEq/L 140  Potassium 3.5 - 5.1 mEq/L 3.8  Chloride 96 - 112 mEq/L 104  CO2 19 - 32 mEq/L 25  Calcium 8.4 - 10.5 mg/dL 8.3(L)  Total Protein - -  Total Bilirubin - -  Alkaline Phos - -  AST - -  ALT - -    Discharge instruction: per After Visit Summary and "Baby and Me Booklet".  After visit meds:    Medication List    TAKE these medications        ibuprofen 800 MG tablet  Commonly known as:  ADVIL,MOTRIN  Take 1 tablet (800 mg total) by mouth every 8 (eight) hours as needed for moderate pain.     oxyCODONE 5 MG immediate release tablet  Commonly known as:  Oxy IR/ROXICODONE  1-2 po q 4 hr prn severe pain     prenatal multivitamin Tabs tablet  Take 1 tablet by mouth at bedtime.        Diet: routine diet  Activity: Advance as tolerated. Pelvic rest for 6 weeks.   Outpatient follow up:6 weeks Follow up Appt:No future appointments. Follow up Visit:No Follow-up on file.  Postpartum contraception: Vasectomy  Newborn Data: Live born female  Birth Weight: 6 lb 4.7 oz (2855 g) APGAR: 9, 9  Baby Feeding: Breast Disposition:home with mother   03/24/2016 Sherian Rein, MD

## 2016-08-23 IMAGING — US US OB COMP LESS 14 WK
1 series · 15 of 17 positions shown · non-contrast
Comparison: None.

CLINICAL DATA: Uncertain LMP. Quantitative beta HCG is [DATE].
Uncertain LMP 07/21/2015. By LMP patient is 9 weeks 4 days. EDC by
LMP is 04/26/2016.

EXAM:
OBSTETRIC <14 WK ULTRASOUND
TECHNIQUE: Transabdominal ultrasound was performed for evaluation of the
gestation as well as the maternal uterus and adnexal regions.

[Series 1: us ob comp less 14 wk · 17 acquisitions, 15 frames shown]
[im 1/17]
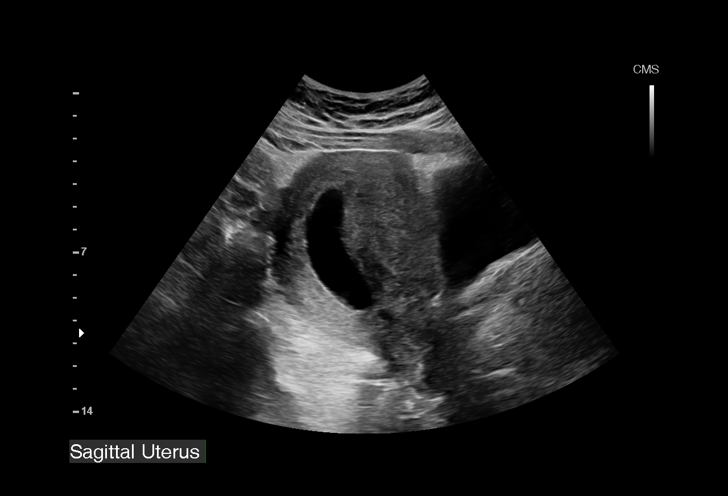
[im 2/17]
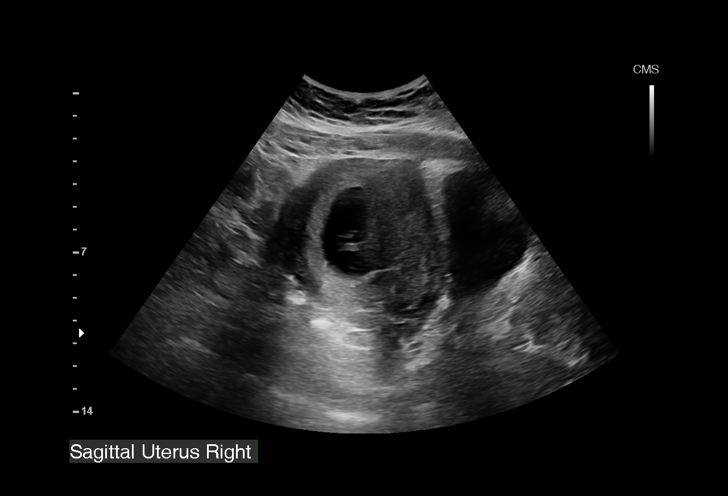
[im 3/17]
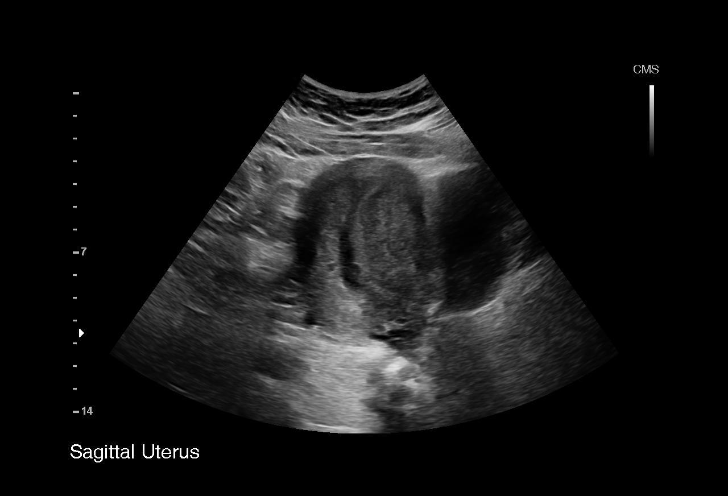
[im 4/17]
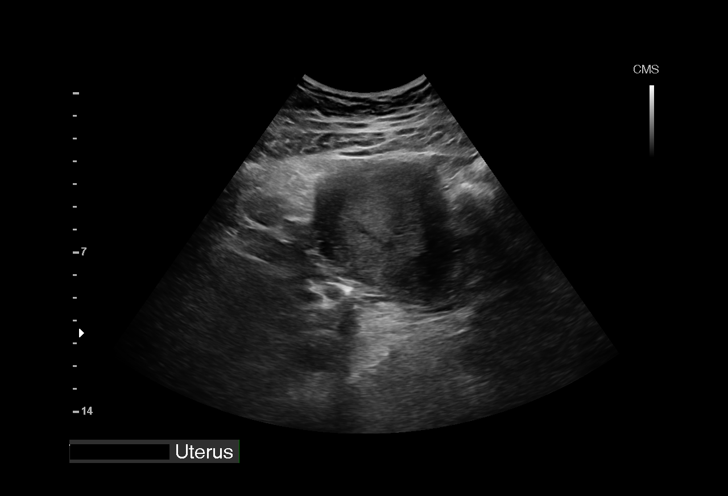
[im 6/17]
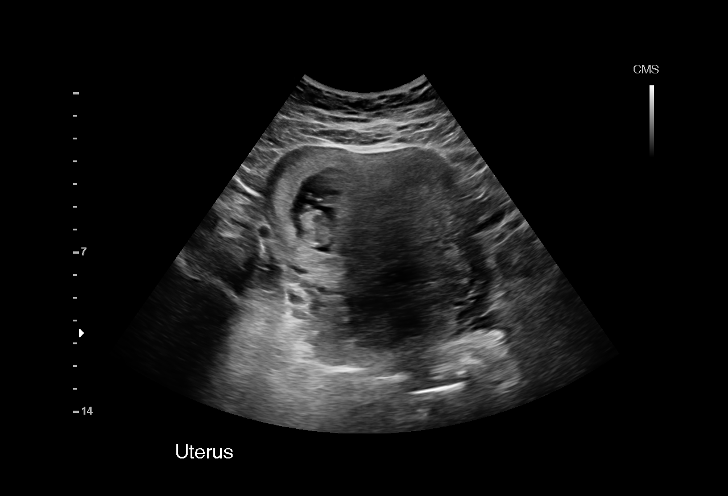
[im 7/17]
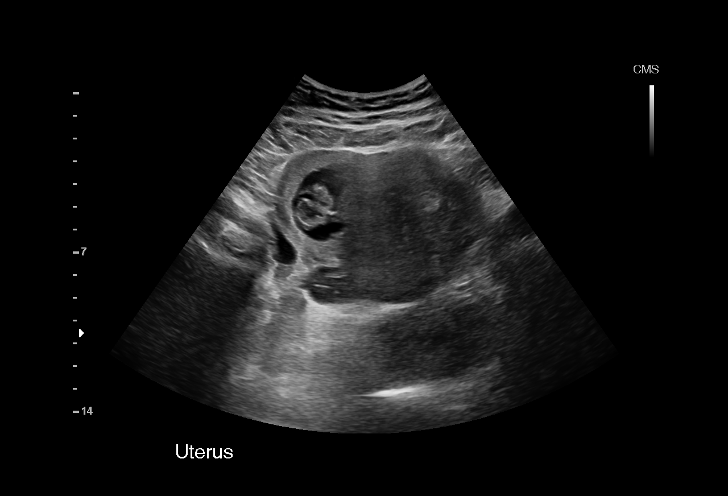
[im 8/17]
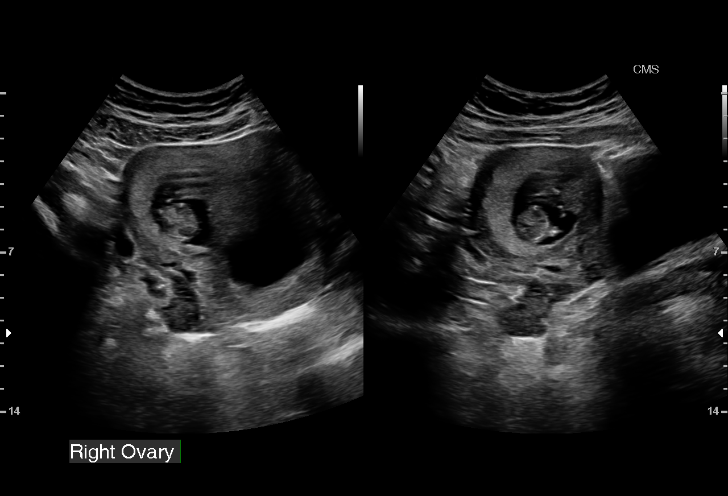
[im 9/17]
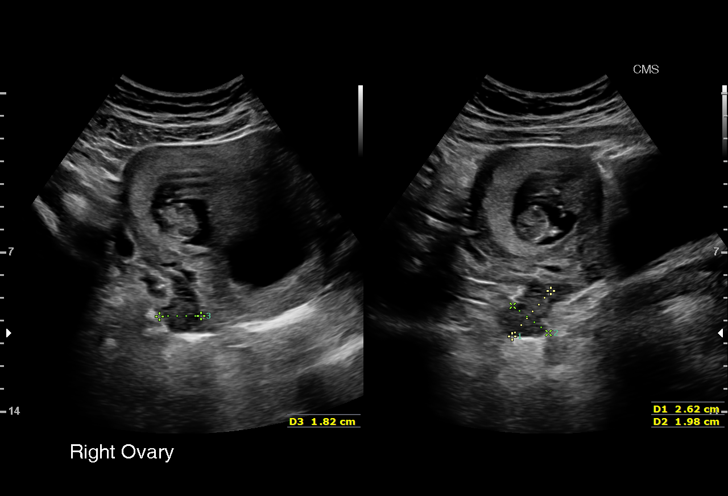
[im 10/17]
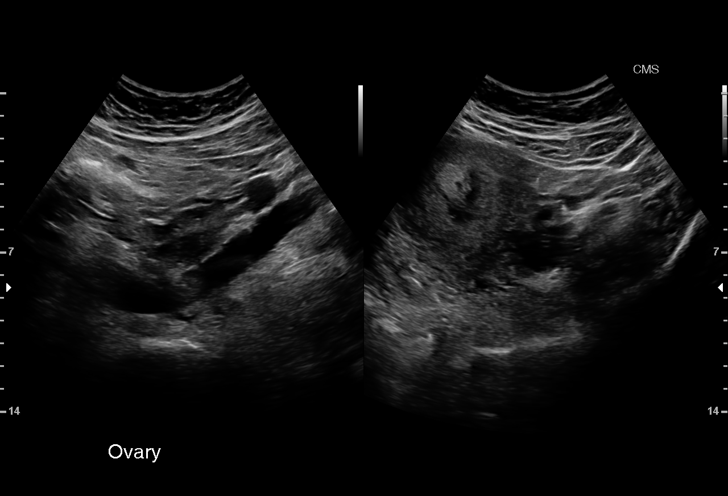
[im 11/17]
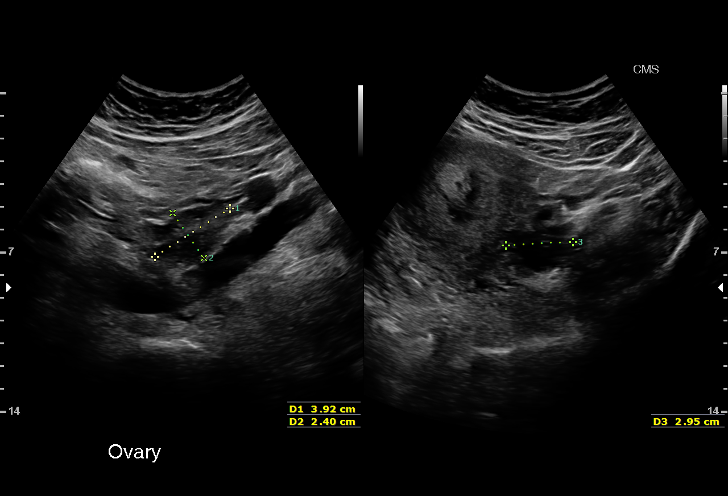
[im 12/17]
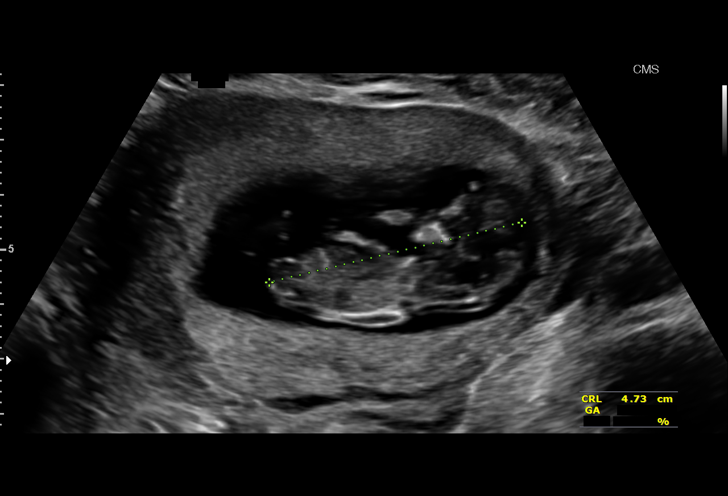
[im 14/17]
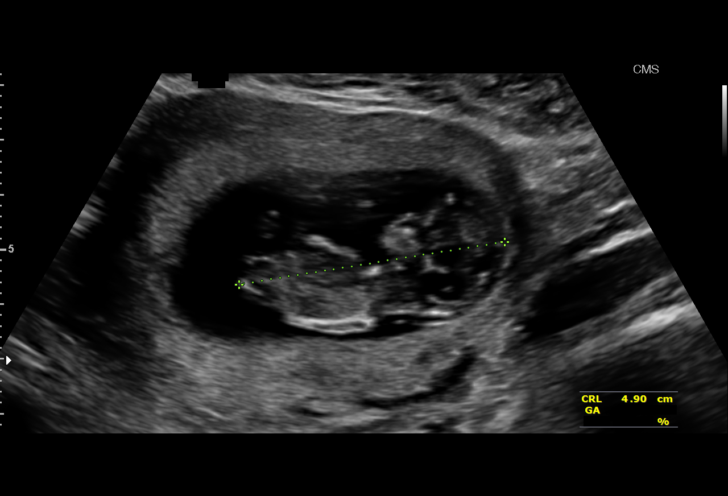
[im 15/17]
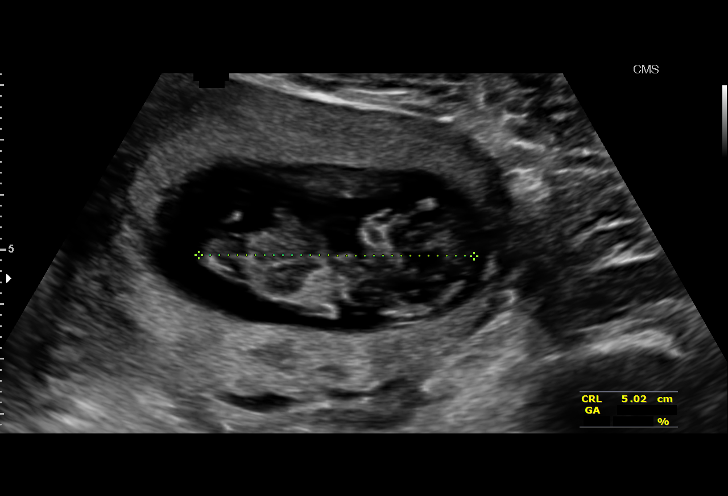
[im 16/17]
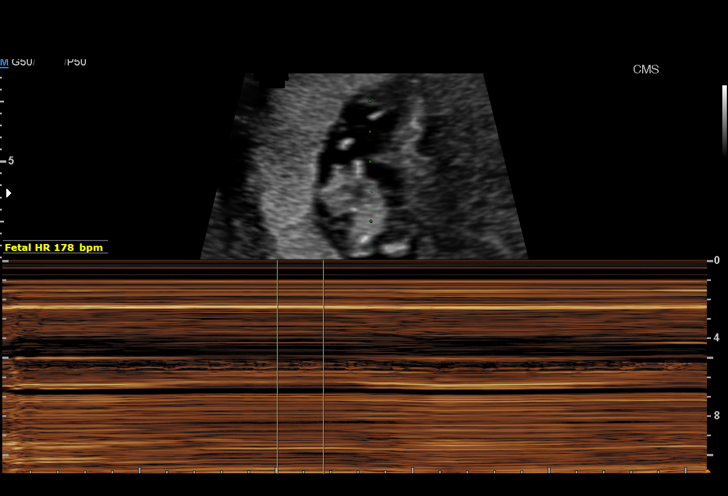
[im 17/17]
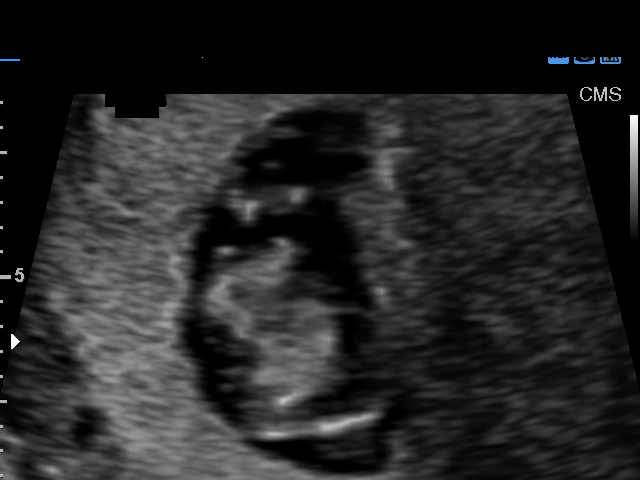

[15 of 17 positions shown; findings below may reference images not displayed]

FINDINGS: Intrauterine gestational sac: Present

Yolk sac:  Not seen

Embryo:  Present

Cardiac Activity: Present

Heart Rate: 178 bpm

CRL:   50.2  mm   11 w 5 d                  US EDC: 04/11/2016

Subchorionic hemorrhage:  No subchorionic hemorrhage identified.

Maternal uterus/adnexae: The ovaries have a normal appearance. No
free pelvic fluid.
IMPRESSION: 1. Single living intrauterine embryo.
2. Ultrasound dating differs from clinical dating.
3. By today's exam EDC is 04/11/2016.

## 2020-08-17 ENCOUNTER — Other Ambulatory Visit: Payer: Self-pay

## 2020-08-17 ENCOUNTER — Other Ambulatory Visit (HOSPITAL_COMMUNITY)
Admission: RE | Admit: 2020-08-17 | Discharge: 2020-08-17 | Disposition: A | Discharge status: Home / Self Care | Payer: Medicaid Other | Source: Ambulatory Visit | Attending: Obstetrics | Admitting: Obstetrics

## 2020-08-17 ENCOUNTER — Encounter: Payer: Self-pay | Admitting: Obstetrics

## 2020-08-17 ENCOUNTER — Ambulatory Visit (INDEPENDENT_AMBULATORY_CARE_PROVIDER_SITE_OTHER): Payer: Medicaid Other | Admitting: Obstetrics

## 2020-08-17 VITALS — BP 106/67 | HR 80 | Ht 65.0 in | Wt 188.0 lb

## 2020-08-17 DIAGNOSIS — Z01419 Encounter for gynecological examination (general) (routine) without abnormal findings: Secondary | ICD-10-CM

## 2020-08-17 DIAGNOSIS — N898 Other specified noninflammatory disorders of vagina: Secondary | ICD-10-CM | POA: Insufficient documentation

## 2020-08-17 DIAGNOSIS — F418 Other specified anxiety disorders: Secondary | ICD-10-CM

## 2020-08-17 DIAGNOSIS — N939 Abnormal uterine and vaginal bleeding, unspecified: Secondary | ICD-10-CM | POA: Diagnosis not present

## 2020-08-17 DIAGNOSIS — G43009 Migraine without aura, not intractable, without status migrainosus: Secondary | ICD-10-CM

## 2020-08-17 DIAGNOSIS — Z113 Encounter for screening for infections with a predominantly sexual mode of transmission: Secondary | ICD-10-CM

## 2020-08-17 MED ORDER — SLYND 4 MG PO TABS: 1.0000 | ORAL_TABLET | Freq: Every day | ORAL | 11 refills | Status: DC

## 2020-08-17 NOTE — Progress Notes (Signed)
[name] is a 34 y.o. female here for a routine exam.  Current complaints: Irregular menstrual cycles for the past year, sometimes every 2 weeks.  Husband has a vasectomy, so she does not need contraception.  Has been hesitant to start hormonal regulation of her chyles because side effects in the past and history of depression and migraines, with possible exacerbation with estrogen.  Personal health questionnaire:  Is patient Ashkenazi Jewish, have a family history of breast and/or ovarian cancer: no Is there a family history of uterine cancer diagnosed at age < 85, gastrointestinal cancer, urinary tract cancer, family member who is a Personnel officer syndrome-associated carrier: no Is the patient overweight and hypertensive, family history of diabetes, personal history of gestational diabetes, preeclampsia or PCOS: no Is patient over 83, have PCOS,  family history of premature CHD under age 50, diabetes, smoke, have hypertension or peripheral artery disease:  no At any time, has a partner hit, kicked or otherwise hurt or frightened you?: no Over the past 2 weeks, have you felt down, depressed or hopeless?: no Over the past 2 weeks, have you felt little interest or pleasure in doing things?:no   Gynecologic History Patient's last menstrual period was 08/10/2020. Contraception: vasectomy Last Pap: unknown. Results were: normal Last mammogram: /a. Results were: n/a  Obstetric History OB History  Gravida Para Term Preterm AB Living  5 5 5     5   SAB TAB Ectopic Multiple Live Births        0 5    # Outcome Date GA Lbr Len/2nd Weight Sex Delivery Anes PTL Lv  5 Term 03/22/16 [redacted]w[redacted]d 09:59 / 00:13 6 lb 4.7 oz (2.855 kg) M Vag-Spont None  LIV  4 Term 01/26/14 [redacted]w[redacted]d 03:02 / 00:20 6 lb 12 oz (3.062 kg) M Vag-Spont EPI  LIV  3 Term 2012 [redacted]w[redacted]d 02:00 6 lb 13 oz (3.09 kg) M Vag-Spont   LIV  2 Term 2008 [redacted]w[redacted]d 01:00 6 lb 4 oz (2.835 kg) F Vag-Spont   LIV  1 Term 2007 [redacted]w[redacted]d 03:00 7 lb 8 oz (3.402  kg) M Vag-Spont   LIV    Past Medical History:  Diagnosis Date  . Anemia   . Arthritis   . Depression   . Headache   . Medical history non-contributory   . Normal pregnancy, repeat 01/25/2014  . SVD (spontaneous vaginal delivery) 01/26/2014    Past Surgical History:  Procedure Laterality Date  . WISDOM TOOTH EXTRACTION       Current Outpatient Medications:  .  escitalopram (LEXAPRO) 10 MG tablet, SMARTSIG:1 Tablet(s) By Mouth Every Evening, Disp: , Rfl:  .  ibuprofen (ADVIL,MOTRIN) 800 MG tablet, Take 1 tablet (800 mg total) by mouth every 8 (eight) hours as needed for moderate pain. (Patient not taking: Reported on 08/17/2020), Disp: 45 tablet, Rfl: 1 .  oxyCODONE (OXY IR/ROXICODONE) 5 MG immediate release tablet, 1-2 po q 4 hr prn severe pain (Patient not taking: Reported on 08/17/2020), Disp: 15 tablet, Rfl: 0 .  Prenatal Vit-Fe Fumarate-FA (PRENATAL MULTIVITAMIN) TABS tablet, Take 1 tablet by mouth at bedtime. (Patient not taking: Reported on 08/17/2020), Disp: 100 tablet, Rfl: 3 No Known Allergies  Social History   Tobacco Use  . Smoking status: Never Smoker  . Smokeless tobacco: Never Used  Substance Use Topics  . Alcohol use: No    Family History  Problem Relation Age of Onset  . Diabetes Maternal Grandfather   . Alcohol  abuse Neg Hx   . Arthritis Neg Hx   . Asthma Neg Hx   . Birth defects Neg Hx   . Cancer Neg Hx   . COPD Neg Hx   . Depression Neg Hx   . Drug abuse Neg Hx   . Early death Neg Hx   . Hearing loss Neg Hx   . Heart disease Neg Hx   . Hyperlipidemia Neg Hx   . Hypertension Neg Hx   . Kidney disease Neg Hx   . Learning disabilities Neg Hx   . Mental illness Neg Hx   . Mental retardation Neg Hx   . Miscarriages / Stillbirths Neg Hx   . Stroke Neg Hx   . Vision loss Neg Hx   . Varicose Veins Neg Hx       Review of Systems  Constitutional: negative for fatigue and weight loss Respiratory: negative for cough and wheezing Cardiovascular:  negative for chest pain, fatigue and palpitations Gastrointestinal: negative for abdominal pain and change in bowel habits Musculoskeletal:negative for myalgias Neurological: positive for migraines without aura Behavioral/Psych: positive for depression with anxiety Endocrine: negative for temperature intolerance    Genitourinary: positive for abnormal menstrual periods ,and vaginal discharge.  Negative for genital lesions, hot flashes, sexual problems  Integument/breast: negative for breast lump, breast tenderness, nipple discharge and skin lesion(s)    Objective:       BP 106/67   Pulse 80   Ht 5\' 5"  (1.651 m)   Wt 188 lb (85.3 kg)   LMP 08/10/2020   BMI 31.28 kg/m  General:   alert and no distress  Skin:   no rash or abnormalities  Lungs:   clear to auscultation bilaterally  Heart:   regular rate and rhythm, S1, S2 normal, no murmur, click, rub or gallop  Breasts:   normal without suspicious masses, skin or nipple changes or axillary nodes  Abdomen:  normal findings: no organomegaly, soft, non-tender and no hernia  Pelvis:  External genitalia: normal general appearance Urinary system: urethral meatus normal and bladder without fullness, nontender Vaginal: normal without tenderness, induration or masses Cervix: normal appearance.  Friable, bled easily with pap. Adnexa: normal bimanual exam Uterus: anteverted and non-tender, normal size   Lab Review Urine pregnancy test Labs reviewed yes Radiologic studies reviewed no  50% of 20 min visit spent on counseling and coordination of care.   Assessment:     1. Encounter for routine gynecological examination with Papanicolaou smear of cervix Rx: - Cytology - PAP( [facility])  2. Abnormal uterine bleeding (AUB) Rx: - 08/12/2020 PELVIC COMPLETE WITH TRANSVAGINAL; Future - options discussed, and she is willing to try a progestin-only hormonal regulation of cycles - Drospirenone (SLYND) 4 MG TABS; Take 1 tablet by mouth daily before  breakfast.  Dispense: 28 tablet; Refill: 11  3. Vaginal discharge Rx: - Cervicovaginal ancillary only( [place])  4. Screening for STDs (sexually transmitted diseases) Rx: - Hepatitis C Antibody - Hepatitis B surface antigen - RPR - HIV antibody (with reflex)  5. Depression with anxiety - clinically stable on meds - managed by psychiatry  6. Migraine without aura and without status migrainosus, not intractable - clinically stable    Plan:    Education reviewed: calcium supplements, depression evaluation, low fat, low cholesterol diet, safe sex/STD prevention, self breast exams and weight bearing exercise. Follow up in: 2 weeks.    Orders Placed This Encounter  Procedures  . Hepatitis C Antibody  . Hepatitis B  surface antigen  . RPR  . HIV antibody (with reflex)    Brock Bad, MD 08/17/2020 11:16 AM

## 2020-08-17 NOTE — Progress Notes (Signed)
New GYN referral from [facility] for irregular cycle. AEX/PAP  Last PAP 03/28/2009

## 2020-08-18 LAB — RPR: RPR Ser Ql: NONREACTIVE

## 2020-08-18 LAB — HEPATITIS B SURFACE ANTIGEN: Hepatitis B Surface Ag: NEGATIVE

## 2020-08-18 LAB — HIV ANTIBODY (ROUTINE TESTING W REFLEX): HIV Screen 4th Generation wRfx: NONREACTIVE

## 2020-08-18 LAB — HEPATITIS C ANTIBODY: Hep C Virus Ab: <0.1 s/co ratio (ref 0.0–0.9)

## 2020-08-20 LAB — CYTOLOGY - PAP
Comment: NEGATIVE
Diagnosis: NEGATIVE
High risk HPV: NEGATIVE

## 2020-08-20 LAB — CERVICOVAGINAL ANCILLARY ONLY
Bacterial Vaginitis (gardnerella): NEGATIVE
Candida Glabrata: NEGATIVE
Candida Vaginitis: NEGATIVE
Chlamydia: NEGATIVE
Comment: NEGATIVE
Comment: NEGATIVE
Comment: NEGATIVE
Comment: NEGATIVE
Comment: NEGATIVE
Comment: NORMAL
Neisseria Gonorrhea: NEGATIVE
Trichomonas: NEGATIVE

## 2020-08-21 ENCOUNTER — Telehealth: Payer: Self-pay | Admitting: *Deleted

## 2020-08-21 NOTE — Telephone Encounter (Signed)
Patient called stating [name] is not covered by her insurance and requesting something else be sent to pharmacy. Please advise.  Clovis Pu, RN

## 2020-08-22 ENCOUNTER — Other Ambulatory Visit: Payer: Self-pay | Admitting: Obstetrics

## 2020-08-22 DIAGNOSIS — N939 Abnormal uterine and vaginal bleeding, unspecified: Secondary | ICD-10-CM

## 2020-08-22 NOTE — Telephone Encounter (Signed)
I'm going to mail out a coupon to patient for the [facility] Rx.

## 2020-09-03 ENCOUNTER — Other Ambulatory Visit: Payer: Self-pay

## 2020-09-03 ENCOUNTER — Ambulatory Visit
Admission: RE | Admit: 2020-09-03 | Discharge: 2020-09-03 | Disposition: A | Discharge status: Home / Self Care | Payer: Medicaid Other | Source: Ambulatory Visit | Attending: Obstetrics | Admitting: Obstetrics

## 2020-09-03 DIAGNOSIS — N939 Abnormal uterine and vaginal bleeding, unspecified: Secondary | ICD-10-CM | POA: Insufficient documentation

## 2020-09-04 ENCOUNTER — Ambulatory Visit: Payer: Medicaid Other

## 2020-09-06 ENCOUNTER — Ambulatory Visit (INDEPENDENT_AMBULATORY_CARE_PROVIDER_SITE_OTHER): Payer: Medicaid Other | Admitting: Obstetrics

## 2020-09-06 ENCOUNTER — Encounter: Payer: Self-pay | Admitting: Obstetrics

## 2020-09-06 DIAGNOSIS — G43009 Migraine without aura, not intractable, without status migrainosus: Secondary | ICD-10-CM | POA: Diagnosis not present

## 2020-09-06 DIAGNOSIS — N926 Irregular menstruation, unspecified: Secondary | ICD-10-CM

## 2020-09-06 DIAGNOSIS — F418 Other specified anxiety disorders: Secondary | ICD-10-CM

## 2020-09-06 NOTE — Progress Notes (Signed)
TELEHEALTH GYNECOLOGY VISIT ENCOUNTER NOTE  I connected with [name] on 09/06/20 at 10:30 AM EST by telephone at home and verified that I am speaking with the correct person using two identifiers. Phone call from provider, Dr. Clearance Coots at [facility], to the patient, [name], at home.   I discussed the limitations, risks, security and privacy concerns of performing an evaluation and management service by telephone and the availability of in person appointments. I also discussed with the patient that there may be a patient responsible charge related to this service. The patient expressed understanding and agreed to proceed.   History:  [name] is a 34 y.o. W6O0355 female being evaluated today for irregular periods. She denies any abnormal vaginal discharge, pelvic pain or other concerns.       Past Medical History:  Diagnosis Date  . Anemia   . Arthritis   . Depression   . Headache   . Medical history non-contributory   . Normal pregnancy, repeat 01/25/2014  . SVD (spontaneous vaginal delivery) 01/26/2014   Past Surgical History:  Procedure Laterality Date  . WISDOM TOOTH EXTRACTION     The following portions of the patient's history were reviewed and updated as appropriate: allergies, current medications, past family history, past medical history, past social history, past surgical history and problem list.   Health Maintenance:  Normal pap and negative HRHPV on 08-17-2020.    Review of Systems:  Pertinent items noted in HPI and remainder of comprehensive ROS otherwise negative.  Physical Exam:   General:  Alert, oriented and cooperative.   Mental Status: Normal mood and affect perceived. Normal judgment and thought content.  Physical exam deferred due to nature of the encounter  Labs and Imaging No results found for this or any previous visit (from the past 336 hour(s)). US PELVIC COMPLETE WITH TRANSVAGINAL  Result Date: 09/03/2020 CLINICAL DATA:   Abnormal uterine bleeding, LMP 08/10/2020, G5P5 EXAM: TRANSABDOMINAL AND TRANSVAGINAL ULTRASOUND OF PELVIS TECHNIQUE: Both transabdominal and transvaginal ultrasound examinations of the pelvis were performed. Transabdominal technique was performed for global imaging of the pelvis including uterus, ovaries, adnexal regions, and pelvic cul-de-sac. It was necessary to proceed with endovaginal exam following the transabdominal exam to visualize the adnexa. COMPARISON:  11/02/2012 FINDINGS: Uterus Measurements: 9.2 x 5.1 x 7.3 cm = volume: 178 mL. Anteverted. Heterogeneous myometrium. No focal mass. Endometrium Thickness: 9 mm.  No endometrial fluid or focal abnormality Right ovary Measurements: 3.2 x 1.7 x 2.4 cm = volume: 6.8 mL. Normal morphology without mass Left ovary Measurements: 4.8 x 2.9 x 2.1 cm = volume: 15.7 mL. Normal morphology without mass Other findings No free pelvic fluid or adnexal masses. BILATERAL ureteral jets visualized. Minimally prominent ureterovesical junctions are noted bilaterally, indenting posterior wall bladder, though a discrete cystic collection to suggest ureterocele is not definitely visualized. IMPRESSION: Unremarkable uterus, endometrial complex and ovaries. Minimally prominent but patent ureterovesical junctions bilaterally, though a discrete cystic mass to suggest ureterocele is not definitely visualized. Electronically Signed   By: Ulyses Southward M.D.   On: 09/03/2020 15:40      Assessment and Plan:     1. Irregular menstrual cycles Rx: - Slynd dispensed ( 3 packs ) for cycle.  Patient has history of depression and migraines, and I wanted to avoid estrogen - follow up in 3 months  2. Depression - clinically stable on meds  3. Migraines without aura - clinically stable on meds prn      I  discussed the assessment and treatment plan with the patient. The patient was provided an opportunity to ask questions and all were answered. The patient agreed with the plan and  demonstrated an understanding of the instructions.   The patient was advised to call back or seek an in-person evaluation/go to the ED if the symptoms worsen or if the condition fails to improve as anticipated.  I provided 15 minutes of non-face-to-face time during this encounter.   Coral Ceo, MD [facility] for [facility], [facility] 09/06/20

## 2020-12-05 ENCOUNTER — Ambulatory Visit (INDEPENDENT_AMBULATORY_CARE_PROVIDER_SITE_OTHER): Payer: Medicaid Other | Admitting: Obstetrics

## 2020-12-05 ENCOUNTER — Encounter: Payer: Self-pay | Admitting: Obstetrics

## 2020-12-05 ENCOUNTER — Other Ambulatory Visit: Payer: Self-pay

## 2020-12-05 VITALS — BP 112/69 | HR 74 | Wt 200.0 lb

## 2020-12-05 DIAGNOSIS — N926 Irregular menstruation, unspecified: Secondary | ICD-10-CM

## 2020-12-05 DIAGNOSIS — G43009 Migraine without aura, not intractable, without status migrainosus: Secondary | ICD-10-CM

## 2020-12-05 MED ORDER — SLYND 4 MG PO TABS: 1.0000 | ORAL_TABLET | Freq: Every day | ORAL | 3 refills | Status: DC

## 2020-12-05 NOTE — Progress Notes (Signed)
Pt states she is having issues getting BC Rx covered, she is almost out of pills.   Pt has been doing well with Slynd.  Pt is unsure of her LMP.

## 2020-12-05 NOTE — Progress Notes (Signed)
Patient ID: [name], female   DOB: 10-27-1985, 35 y.o.   MRN: [id number]   HPI  [name] is a 35 y.o. female.  History of irregular periods.  Started on [place] because of history of migraines.  Periods now regular.  No complaints today. HPI  Past Medical History:  Diagnosis Date  . Anemia   . Arthritis   . Depression   . Headache   . Medical history non-contributory   . Normal pregnancy, repeat 01/25/2014  . SVD (spontaneous vaginal delivery) 01/26/2014    Past Surgical History:  Procedure Laterality Date  . WISDOM TOOTH EXTRACTION      Family History  Problem Relation Age of Onset  . Diabetes Maternal Grandfather   . Alcohol abuse Neg Hx   . Arthritis Neg Hx   . Asthma Neg Hx   . Birth defects Neg Hx   . Cancer Neg Hx   . COPD Neg Hx   . Depression Neg Hx   . Drug abuse Neg Hx   . Early death Neg Hx   . Hearing loss Neg Hx   . Heart disease Neg Hx   . Hyperlipidemia Neg Hx   . Hypertension Neg Hx   . Kidney disease Neg Hx   . Learning disabilities Neg Hx   . Mental illness Neg Hx   . Mental retardation Neg Hx   . Miscarriages / Stillbirths Neg Hx   . Stroke Neg Hx   . Vision loss Neg Hx   . Varicose Veins Neg Hx     Social History Social History   Tobacco Use  . Smoking status: Never Smoker  . Smokeless tobacco: Never Used  Vaping Use  . Vaping Use: Never used  Substance Use Topics  . Alcohol use: No  . Drug use: No    No Known Allergies  Current Outpatient Medications  Medication Sig Dispense Refill  . Drospirenone (SLYND) 4 MG TABS Take 1 tablet by mouth daily. 28 tablet 3  . DULoxetine (CYMBALTA) 20 MG capsule Take 20 mg by mouth daily.    . Drospirenone (SLYND) 4 MG TABS Take 1 tablet by mouth daily before breakfast. 28 tablet 11  . escitalopram (LEXAPRO) 10 MG tablet SMARTSIG:1 Tablet(s) By Mouth Every Evening    . ibuprofen (ADVIL,MOTRIN) 800 MG tablet Take 1 tablet (800 mg total) by mouth every 8 (eight) hours as needed for  moderate pain. (Patient not taking: Reported on 08/17/2020) 45 tablet 1  . oxyCODONE (OXY IR/ROXICODONE) 5 MG immediate release tablet 1-2 po q 4 hr prn severe pain (Patient not taking: Reported on 08/17/2020) 15 tablet 0  . Prenatal Vit-Fe Fumarate-FA (PRENATAL MULTIVITAMIN) TABS tablet Take 1 tablet by mouth at bedtime. (Patient not taking: Reported on 08/17/2020) 100 tablet 3   No current facility-administered medications for this visit.    Review of Systems Review of Systems Constitutional: negative for fatigue and weight loss Respiratory: negative for cough and wheezing Cardiovascular: negative for chest pain, fatigue and palpitations Gastrointestinal: negative for abdominal pain and change in bowel habits Genitourinary: positive for irregular periods Integument/breast: negative for nipple discharge Musculoskeletal:negative for myalgias Neurological: negative for gait problems and tremors Behavioral/Psych: negative for abusive relationship, depression Endocrine: negative for temperature intolerance      Blood pressure 112/69, pulse 74, weight 200 lb (90.7 kg), unknown if currently breastfeeding.  Physical Exam Physical Exam:   General:   alert and no distress  Skin:   no rash or abnormalities  Lungs:   clear to auscultation bilaterally  Heart:   regular rate and rhythm, S1, S2 normal, no murmur, click, rub or gallop   I have spent a total of 15 minutes of face-to-face time, excluding clinical staff time, reviewing notes and preparing to see patient, ordering tests and/or medications, and counseling the patient.  Data Reviewed Labs Ultrasound   US PELVIC COMPLETE WITH TRANSVAGINAL (Accession [id number]) (Order [id number]) Imaging Date: 09/03/2020 Department: [facility] Released By: Johnny Bridge, NT Authorizing: Brock Bad, MD    Exam Status  Status  Final [99]   PACS Intelerad Image Link  Show images for US PELVIC COMPLETE  WITH TRANSVAGINAL  Study Result  Narrative & Impression  CLINICAL DATA:  Abnormal uterine bleeding, LMP 08/10/2020, G5P5  EXAM: TRANSABDOMINAL AND TRANSVAGINAL ULTRASOUND OF PELVIS  TECHNIQUE: Both transabdominal and transvaginal ultrasound examinations of the pelvis were performed. Transabdominal technique was performed for global imaging of the pelvis including uterus, ovaries, adnexal regions, and pelvic cul-de-sac. It was necessary to proceed with endovaginal exam following the transabdominal exam to visualize the adnexa.  COMPARISON:  11/02/2012  FINDINGS: Uterus  Measurements: 9.2 x 5.1 x 7.3 cm = volume: 178 mL. Anteverted. Heterogeneous myometrium. No focal mass.  Endometrium  Thickness: 9 mm.  No endometrial fluid or focal abnormality  Right ovary  Measurements: 3.2 x 1.7 x 2.4 cm = volume: 6.8 mL. Normal morphology without mass  Left ovary  Measurements: 4.8 x 2.9 x 2.1 cm = volume: 15.7 mL. Normal morphology without mass  Other findings  No free pelvic fluid or adnexal masses. BILATERAL ureteral jets visualized. Minimally prominent ureterovesical junctions are noted bilaterally, indenting posterior wall bladder, though a discrete cystic collection to suggest ureterocele is not definitely visualized.  IMPRESSION: Unremarkable uterus, endometrial complex and ovaries.  Minimally prominent but patent ureterovesical junctions bilaterally, though a discrete cystic mass to suggest ureterocele is not definitely visualized.   Electronically Signed   By: Ulyses Southward M.D.   On: 09/03/2020 15:40     A/P:      Irregular periods.  Much improved on [place].  Continue Slynd ( samples dispensed - 3 packs ) Follow up in 3 months.   Meds ordered this encounter  Medications  . Drospirenone (SLYND) 4 MG TABS    Sig: Take 1 tablet by mouth daily.    Dispense:  28 tablet    Refill:  3     Brock Bad, MD 12/05/2020 9:36 AM

## 2021-04-29 ENCOUNTER — Other Ambulatory Visit: Payer: Self-pay | Admitting: Internal Medicine

## 2021-04-29 LAB — TIQ- AMBIGUOUS ORDER

## 2021-04-29 LAB — TIQ-MISC

## 2021-05-11 LAB — TEST AUTHORIZATION

## 2021-05-11 LAB — DRUG MONITOR,OPIOIDS PNL,QN,URINE
Buprenorphine: NEGATIVE ng/mL (ref <2)
Codeine: NEGATIVE ng/mL (ref <50)
Dextromethorphan: NEGATIVE ng/mL (ref <20)
Dextrorphan: NEGATIVE ng/mL (ref <20)
EDDP: NEGATIVE ng/mL (ref <100)
Fentanyl: NEGATIVE ng/mL (ref <0.5)
Heroin Metabolite: NEGATIVE ng/mL (ref <10)
Hydrocodone: NEGATIVE ng/mL (ref <50)
Hydromorphone: NEGATIVE ng/mL (ref <50)
Methadone: NEGATIVE ng/mL (ref <100)
Mitragynine: NEGATIVE ng/mL (ref <2)
Morphine: NEGATIVE ng/mL (ref <50)
Naloxone: NEGATIVE ng/mL (ref <2)
Norbuprenorphine: NEGATIVE ng/mL (ref <2)
Norfentanyl: NEGATIVE ng/mL (ref <0.5)
Norhydrocodone: NEGATIVE ng/mL (ref <50)
Noroxycodone: NEGATIVE ng/mL (ref <50)
Nortapentadol: NEGATIVE ng/mL (ref <50)
O desmethyltramadol: NEGATIVE ng/mL (ref <100)
Oxycodone: NEGATIVE ng/mL (ref <50)
Oxymorphone: NEGATIVE ng/mL (ref <50)
Tapentadol: NEGATIVE ng/mL (ref <50)
Tramadol: NEGATIVE ng/mL (ref <100)

## 2021-05-11 LAB — VARICELLA ZOSTER ANTIBODY, IGG: Varicella IgG: 716.8 index

## 2021-05-11 LAB — VITAMIN D 25 HYDROXY (VIT D DEFICIENCY, FRACTURES): Vit D, 25-Hydroxy: 29 ng/mL — ABNORMAL LOW (ref 30–100)

## 2021-05-11 LAB — QUANTIFERON-TB GOLD PLUS
Mitogen-NIL: >10 IU/mL
NIL: 0.06 IU/mL
QuantiFERON-TB Gold Plus: POSITIVE — AB
TB1-NIL: 0.06 IU/mL
TB2-NIL: 0.56 IU/mL

## 2021-05-11 LAB — LIPID PANEL
Cholesterol: 204 mg/dL — ABNORMAL HIGH (ref <200)
HDL: 46 mg/dL — ABNORMAL LOW (ref >=50)
LDL Cholesterol (Calc): 121 mg/dL (calc) — ABNORMAL HIGH
Non-HDL Cholesterol (Calc): 158 mg/dL (calc) — ABNORMAL HIGH (ref <130)
Total CHOL/HDL Ratio: 4.4 (calc) (ref <5.0)
Triglycerides: 242 mg/dL — ABNORMAL HIGH (ref <150)

## 2021-05-11 LAB — CBC
HCT: 37.8 % (ref 35.0–45.0)
Hemoglobin: 12.3 g/dL (ref 11.7–15.5)
MCH: 25.9 pg — ABNORMAL LOW (ref 27.0–33.0)
MCHC: 32.5 g/dL (ref 32.0–36.0)
MCV: 79.7 fL — ABNORMAL LOW (ref 80.0–100.0)
MPV: 10.3 fL (ref 7.5–12.5)
Platelets: 390 10*3/uL (ref 140–400)
RBC: 4.74 10*6/uL (ref 3.80–5.10)
RDW: 14.4 % (ref 11.0–15.0)
WBC: 7.4 10*3/uL (ref 3.8–10.8)

## 2021-05-11 LAB — MEASLES/MUMPS/RUBELLA IMMUNITY
Mumps IgG: 65.8 AU/mL
Rubella: 4.07 Index
Rubeola IgG: <13.5 AU/mL — ABNORMAL LOW

## 2021-05-11 LAB — DM TEMPLATE

## 2021-05-11 LAB — TSH: TSH: 1.04 mIU/L

## 2021-08-01 IMAGING — US US PELVIS COMPLETE WITH TRANSVAGINAL
1 series · 15 of 25 positions shown · non-contrast
Comparison: 11/02/2012

CLINICAL DATA: Abnormal uterine bleeding, LMP 08/10/2020, G5P5



[Series 1: us pelvis complete with transvaginal · 15 of 100 slices shown]
[im 1/100]
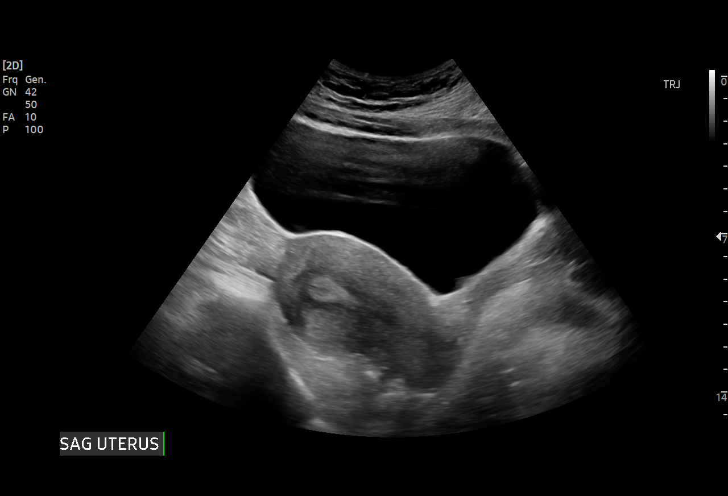
[im 9/100]
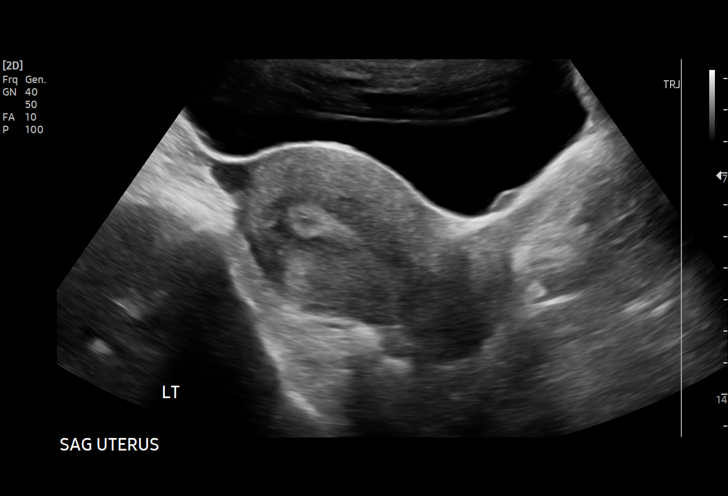
[im 17/100]
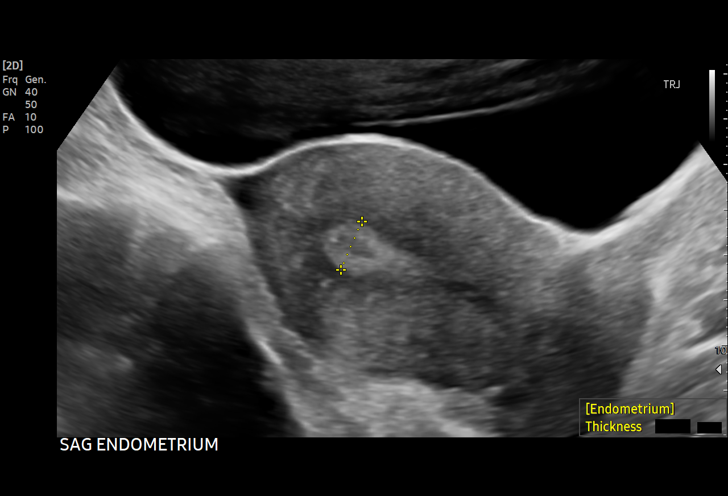
[im 21/100]
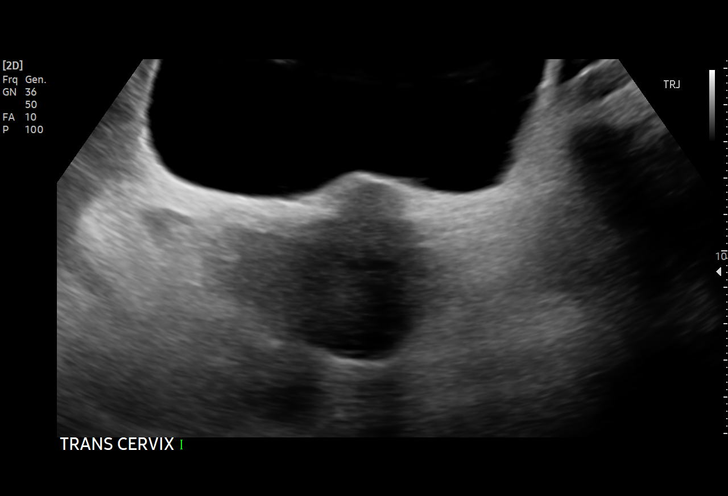
[im 29/100]
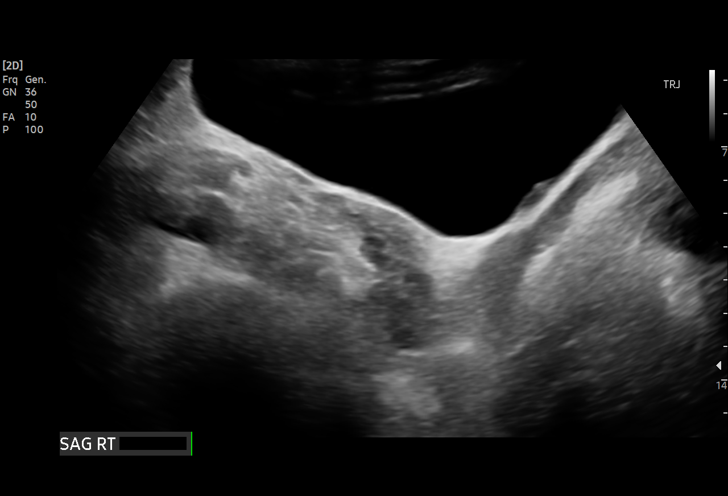
[im 38/100]
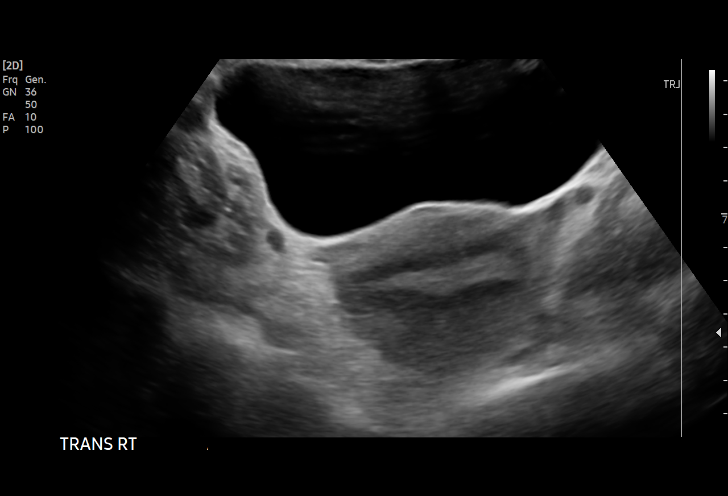
[im 42/100]
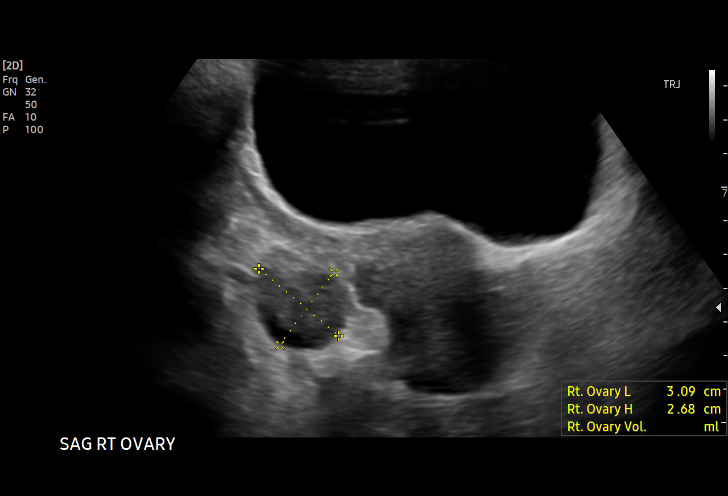
[im 50/100]
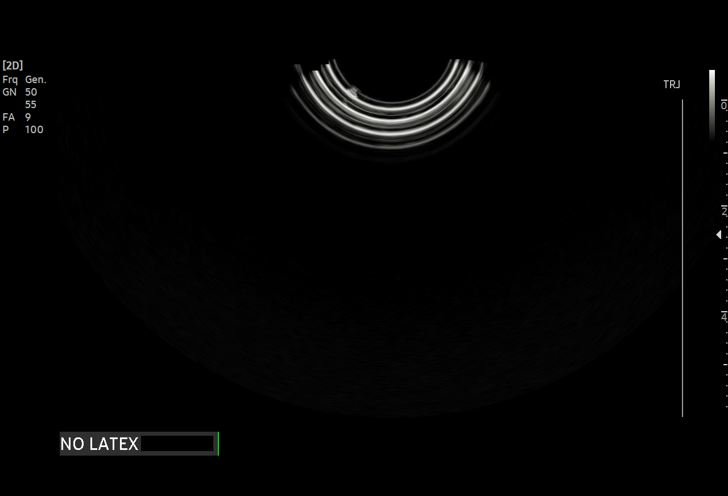
[im 58/100]
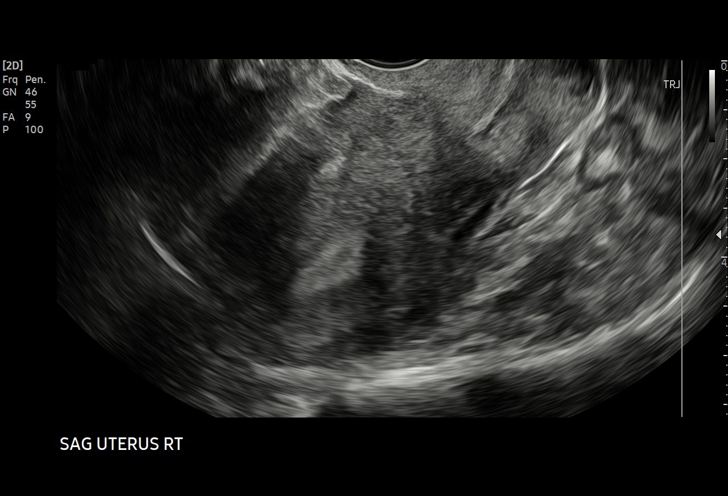
[im 62/100]
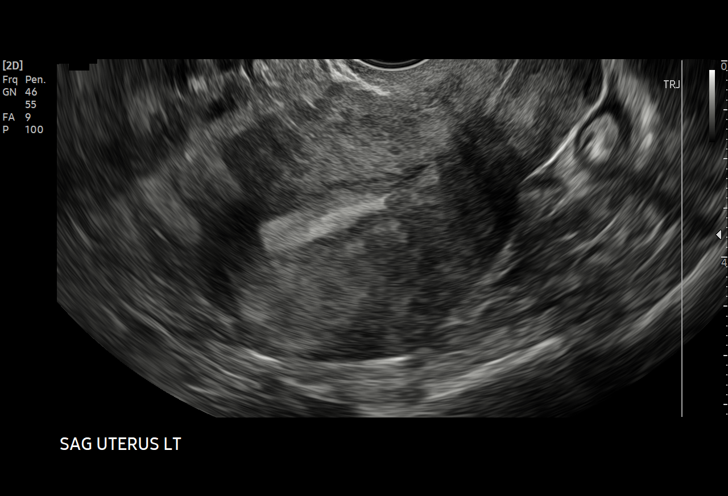
[im 71/100]
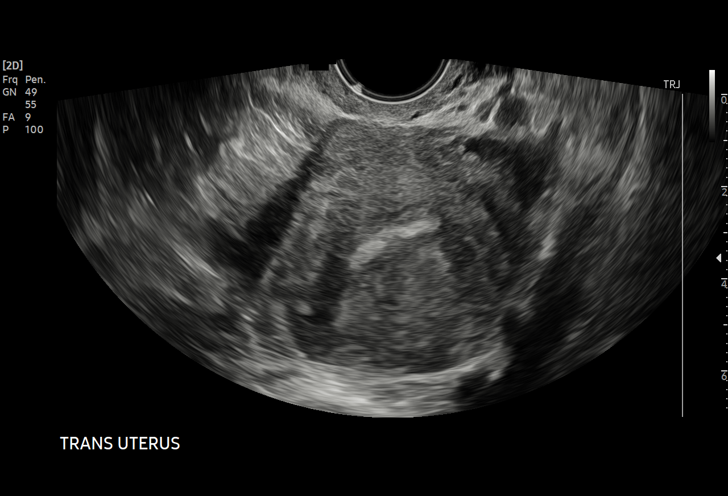
[im 79/100]
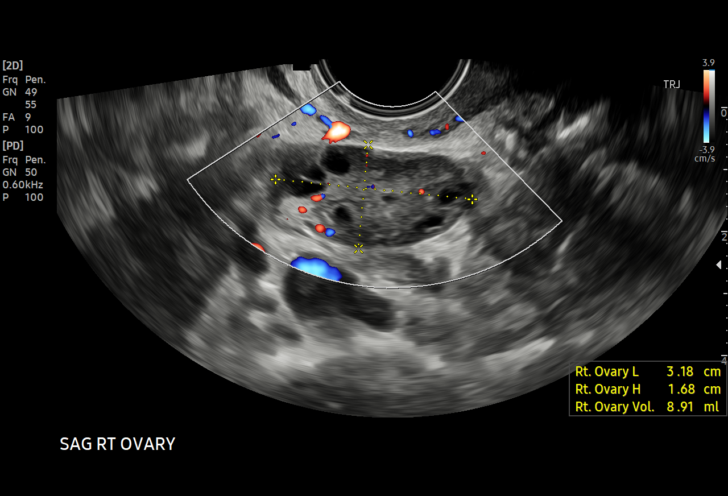
[im 83/100]
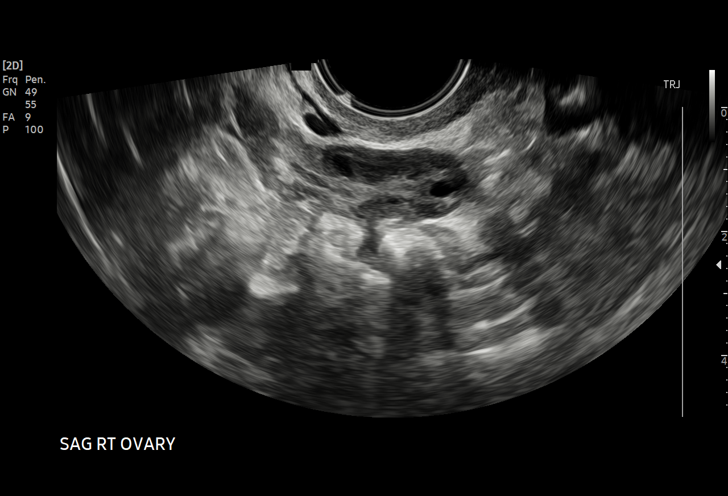
[im 91/100]
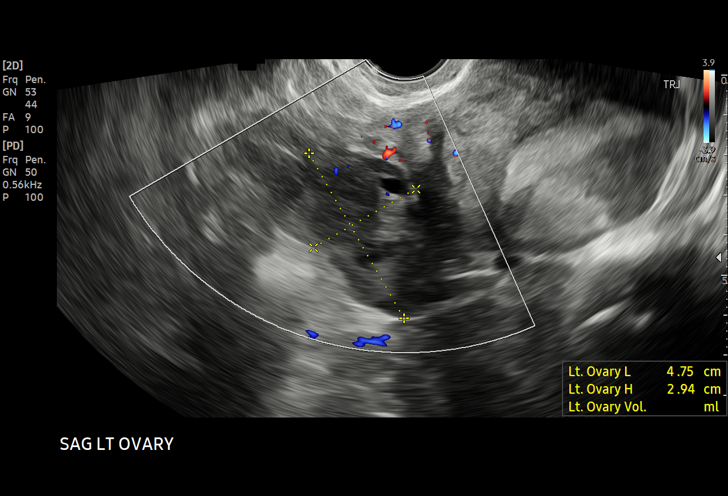
[im 100/100]
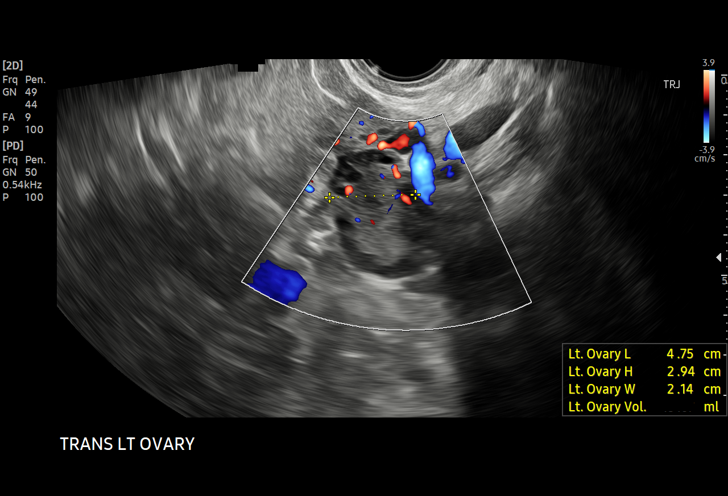

[15 of 25 positions shown; findings below may reference images not displayed]

FINDINGS: Uterus

Measurements: 9.2 x 5.1 x 7.3 cm = volume: 178 mL. Anteverted.
Heterogeneous myometrium. No focal mass.

Endometrium

Thickness: 9 mm.  No endometrial fluid or focal abnormality

Right ovary

Measurements: 3.2 x 1.7 x 2.4 cm = volume: 6.8 mL. Normal morphology
without mass

Left ovary

Measurements: 4.8 x 2.9 x 2.1 cm = volume: 15.7 mL. Normal
morphology without mass

Other findings

No free pelvic fluid or adnexal masses. BILATERAL ureteral jets
visualized. Minimally prominent ureterovesical junctions are noted
bilaterally, indenting posterior wall bladder, though a discrete
cystic collection to suggest ureterocele is not definitely
visualized.
IMPRESSION: Unremarkable uterus, endometrial complex and ovaries.

Minimally prominent but patent ureterovesical junctions bilaterally,
though a discrete cystic mass to suggest ureterocele is not
definitely visualized.

## 2021-08-30 ENCOUNTER — Other Ambulatory Visit: Payer: Self-pay | Admitting: *Deleted

## 2021-08-30 DIAGNOSIS — N926 Irregular menstruation, unspecified: Secondary | ICD-10-CM

## 2021-08-30 MED ORDER — SLYND 4 MG PO TABS: 1.0000 | ORAL_TABLET | Freq: Every day | ORAL | 2 refills | Status: DC

## 2021-12-02 ENCOUNTER — Other Ambulatory Visit: Payer: Self-pay | Admitting: Obstetrics

## 2021-12-02 DIAGNOSIS — N926 Irregular menstruation, unspecified: Secondary | ICD-10-CM

## 2023-05-19 ENCOUNTER — Ambulatory Visit: Payer: Medicaid Other | Admitting: Family Medicine

## 2024-06-17 ENCOUNTER — Ambulatory Visit: Admitting: Podiatry

## 2024-06-20 ENCOUNTER — Ambulatory Visit: Admitting: Podiatry

## 2024-07-15 ENCOUNTER — Ambulatory Visit (INDEPENDENT_AMBULATORY_CARE_PROVIDER_SITE_OTHER): Admitting: Podiatry

## 2024-07-15 ENCOUNTER — Ambulatory Visit (INDEPENDENT_AMBULATORY_CARE_PROVIDER_SITE_OTHER)

## 2024-07-15 VITALS — Ht 65.0 in | Wt 200.0 lb

## 2024-07-15 DIAGNOSIS — M7752 Other enthesopathy of left foot: Secondary | ICD-10-CM | POA: Diagnosis not present

## 2024-07-15 DIAGNOSIS — B351 Tinea unguium: Secondary | ICD-10-CM

## 2024-07-15 DIAGNOSIS — M25572 Pain in left ankle and joints of left foot: Secondary | ICD-10-CM | POA: Diagnosis not present

## 2024-07-15 MED ORDER — MELOXICAM 15 MG PO TABS: 15.0000 mg | ORAL_TABLET | Freq: Every day | ORAL | 0 refills | Status: DC | PRN

## 2024-07-15 MED ORDER — CICLOPIROX 8 % EX SOLN: Freq: Every day | CUTANEOUS | 2 refills | Status: DC

## 2024-07-15 NOTE — Progress Notes (Signed)
 Subjective:  Patient ID: [name], female    DOB: 1986/06/11,  MRN: [id number]  Chief Complaint  Patient presents with   Foot Pain    RM 11 NEW PATIENT 15 - Left ankle and foot pain for over 1 month now. Pt was seen at [facility] on 06/17/24. Pt is currently taking prednisone and using an ankle stabilizing brace with no relief.     Discussed the use of AI scribe software for clinical note transcription with the patient, who gave verbal consent to proceed.  History of Present Illness [name] is a 38 year old female who presents with persistent left ankle pain.  The left ankle pain began gradually after a hiking trip on Labor Day, with no specific injury. It worsened over time despite treatment with prednisone and wearing an ankle brace. The pain is described as 'bursts of pain going to the toes' when sitting, most pronounced on the lateral side of the ankle.  Prolonged standing and walking exacerbate the pain. She experiences discomfort upon standing after sitting and in the morning when taking the first steps out of bed, causing an initial limp. Advil  is taken as needed for pain relief.  Also ask about treatment for her big toenails is getting somewhat thicker.  No pain to the nail.        Objective:  There were no vitals filed for this visit.  Physical Exam General: AAO x3, NAD  Dermatological: Hallux toenail is mild hypertrophy to the nail with yellow discoloration.  No edema, erythema.  Vascular: Dorsalis Pedis artery and Posterior Tibial artery pedal pulses are 2/4 bilateral with immedate capillary fill time. There is no pain with calf compression, swelling, warmth, erythema.   Neruologic: Grossly intact via light touch bilateral.   Musculoskeletal: There is tenderness palpation on anterior lateral aspect of the ankle joint as well as the sinus tarsi but mostly to the ankle joint.  Some slight edema present there is no erythema or warmth.  Is no pain or  crepitation with ankle joint range of motion.  MMT 5/5.     No images are attached to the encounter.    Results RADIOLOGY Ankle X-ray: No signs of fracture or stress fracture; ankle joint space maintained.  PROCEDURE Intra-articular steroid injection: Injection administered to the ankle joint.   Assessment:   1. Capsulitis of ankle, left    2.      Onychomycosis  Plan:  Patient was evaluated and treated and all questions answered.  Assessment and Plan Assessment & Plan Chronic left ankle and foot joint pain with inflammation Chronic pain and inflammation in the left ankle and foot, likely involving the subtalar joint. Previous treatments provided limited relief. Imaging ruled out fracture. Steroid injection chosen for expedited relief. - Administer steroid injection into the joint.  Verbal consent obtained.  She wishes to proceed with a steroid injection.  I cleaned the skin with Betadine, alcohol.  Mixture 1 cc Kenalog 10, 0.5 cc of Marcaine plain, 0.5 cc of lidocaine  plain was infiltrated into the anterior lateral aspect ankle joint without complications.  Postinjection care discussed.  Tolerated well. - Prescribe meloxicam 15 mg once daily. - Advise daily icing of the affected area. - Provide a compression sock for support. - Continue use of ankle brace. - Provide exercises to strengthen ligaments and tendons. - Consider walking boot for additional immobilization if needed. - Monitor for gastrointestinal side effects from meloxicam; advise taking with food.  Onychomycosis of toenail Thickened  big toenail with discomfort, consistent with onychomycosis. No severe discoloration or complications. - Prescribe ciclopirox topical solution daily. - Instruct weekly cleaning of the nail with rubbing alcohol.   Return in about 4 weeks (around 08/12/2024), or if symptoms worsen or fail to improve.   Donnice JONELLE Fees DPM

## 2024-07-15 NOTE — Patient Instructions (Addendum)
 While at your visit today you received a steroid injection in your foot or ankle to help with your pain. Along with having the steroid medication there is some numbing medication in the shot that you received. Due to this you may notice some numbness to the area for the next couple of hours.   I would recommend limiting activity for the next few days to help the steroid injection take affect.    The actually benefit from the steroid injection may take up to 2-7 days to see a difference. You may actually experience a small (as in 10%) INCREASE in pain in the first 24 hours---that is common. It would be best if you can ice the area today and take anti-inflammatory medications (such as Ibuprofen , Motrin , or Aleve) if you are able to take these medications. If you were prescribed another medication to help with the pain go ahead and start that medication today    Things to watch out for that you should contact us  or a health care provider urgently would include: 1. Unusual (as in more than 10%) increase in pain 2. New fever > 101.5 3. New swelling or redness of the injected area.  4. Streaking of red lines around the area injected.  If you have any questions or concerns about this, please give our office a call at [phone].    --  Meloxicam Tablets What is this medication? MELOXICAM (mel OX i cam) treats mild to moderate pain, inflammation, or arthritis. It works by decreasing inflammation. It belongs to a group of medications called NSAIDs. This medicine may be used for other purposes; ask your health care provider or pharmacist if you have questions. COMMON BRAND NAME(S): Mobic What should I tell my care team before I take this medication? They need to know if you have any of these conditions: Asthma Bleeding problems Dehydration Frequently drink alcohol Have had a heart attack, stroke, or mini-stroke Heart bypass surgery, or CABG, within the past 2 weeks Heart or blood vessel  conditions Heart failure High blood pressure Kidney disease Liver disease Stomach bleeding Stomach ulcers, other stomach or intestine problems Tobacco use An unusual or allergic reaction to meloxicam, other medications, foods, dyes, or preservatives Pregnant or trying to get pregnant Breastfeeding How should I use this medication? Take this medication by mouth. Take it as directed on the prescription label at the same time every day. You can take it with or without food. If it upsets your stomach, take it with food. Do not use it more often than directed. There may be unused or extra doses in the bottle after you finish your treatment. Talk to your care team if you have questions about your dose. A special MedGuide will be given to you by the pharmacist with each prescription and refill. Be sure to read this information carefully each time. Talk to your care team about the use of this medication in children. Special care may be needed. People over 69 years of age may have a stronger reaction and need a smaller dose. Overdosage: If you think you have taken too much of this medicine contact a poison control center or emergency room at once. NOTE: This medicine is only for you. Do not share this medicine with others. What if I miss a dose? If you miss a dose, take it as soon as you can. If it is almost time for your next dose, take only that dose. Do not take double or extra doses. What may interact  with this medication? Do not take this medication with any of the following: Cidofovir Ketorolac This medication may also interact with the following: Alcohol Aspirin and aspirin-like medications Blood thinners Cyclosporine Digoxin Diuretics Lithium Medications for high blood pressure Methotrexate Other NSAIDs, medications for pain and inflammation, such as ibuprofen  or naproxen Some medications for depression Steroid medications, such as prednisone or cortisone Supplements, such as  garlic, ginger, ginkgo, methylsulfonylmethane (MSM) This list may not describe all possible interactions. Give your health care provider a list of all the medicines, herbs, non-prescription drugs, or dietary supplements you use. Also tell them if you smoke, drink alcohol, or use illegal drugs. Some items may interact with your medicine. What should I watch for while using this medication? Visit your care team for regular checks on your progress. Tell your care team if your symptoms do not start to get better or if they get worse. Do not take aspirin or other NSAIDs, such as ibuprofen  or naproxen, while you are taking this medication. Side effects, such as upset stomach, nausea, and ulcers, may be more likely to occur. Many over-the-counter medications contain aspirin, ibuprofen , or naproxen. It is important to read labels carefully. Talk to your care team about all the medications you take. They can tell you what is safe to take together. This medication can cause serious bleeding, ulcers, or tears in the stomach. These problems can occur at any time and with no warning signs. They are more common with long-term use. Talk to your care team right away if you have stomach pain, bloody or black, tar-like stools, or vomit blood that is red or looks like coffee grounds. This medication increases the risk of blood clots, heart attack, and stroke. These events can occur at any time. They are more common with long-term use and in those who have heart disease. If you take aspirin to prevent a heart attack or stroke, talk to your care team. They can help you find an option that works for you. This medication may cause serious skin reactions. They can happen weeks to months after starting the medication. Talk to your care team right away if you have fevers or flu-like symptoms with a rash. The rash may be red or purple and then turn into blisters or peeling of the skin. Or you might notice a red rash with swelling of  the face, lips, or lymph nodes in your neck or under your arms. Talk to your care team if you may be pregnant. Taking this medication after 20 weeks of pregnancy may cause serious birth defects. Use of this medication after 30 weeks of pregnancy is not recommended. This medication may cause infertility. It is usually temporary. Talk to your care team if you are concerned about your fertility. What side effects may I notice from receiving this medication? Side effects that you should report to your care team as soon as possible: Allergic reactions--skin rash, itching, hives, swelling of the face, lips, tongue, or throat Bleeding--bloody or black, tar-like stools, vomiting blood or brown material that looks like coffee grounds, red or dark brown urine, small red or purple spots on skin, unusual bruising or bleeding Heart attack--pain or tightness in the chest, shoulders, arms, or jaw, nausea, shortness of breath, cold or clammy skin, feeling faint or lightheaded Heart failure--shortness of breath, swelling of the ankles, feet, or hands, sudden weight gain, unusual weakness or fatigue Increase in blood pressure Kidney injury--decrease in the amount of urine, swelling of the ankles, hands, or  feet Liver injury--right upper belly pain, loss of appetite, nausea, light-colored stool, dark yellow or brown urine, yellowing skin or eyes, unusual weakness or fatigue Rash, fever, and swollen lymph nodes Redness, blistering, peeling, or loosening of the skin, including inside the mouth Round red or dark patches on the skin that may itch, burn, and blister Stroke--sudden numbness or weakness of the face, arm, or leg, trouble speaking, confusion, trouble walking, loss of balance or coordination, dizziness, severe headache, change in vision Side effects that usually do not require medical attention (report these to your care team if they continue or are bothersome): Headache Loss of appetite Nausea Upset  stomach This list may not describe all possible side effects. Call your doctor for medical advice about side effects. You may report side effects to FDA at 1-800-FDA-1088. Where should I keep my medication? Keep out of the reach of children and pets. Store at room temperature between 20 and 25 degrees C (68 and 77 degrees F). Protect from moisture. Keep the container tightly closed. Get rid of any unused medication after the expiration date. To get rid of medications that are no longer needed or have expired: Take the medication to a medication take-back program. Check with your pharmacy or law enforcement to find a location. If you cannot return the medication, check the label or package insert to see if the medication should be thrown out in the garbage or flushed down the toilet. If you are not sure, ask your care team. If it is safe to put it in the trash, empty the medication out of the container. Mix the medication with cat litter, dirt, coffee grounds, or other unwanted substance. Seal the mixture in a bag or container. Put it in the trash. NOTE: This sheet is a summary. It may not cover all possible information. If you have questions about this medicine, talk to your doctor, pharmacist, or health care provider.  2025 Elsevier/Gold Standard (2023-11-10 00:00:00)  -  Ankle Sprain, Phase I Rehab An ankle sprain is an injury to the tissues that connect bone to bone (ligaments) in your ankle. Ankle sprains can cause stiffness, loss of motion, and loss of strength. Ask your health care provider which exercises are safe for you. Do exercises exactly as told by your provider and adjust them as directed. It is normal to feel mild stretching, pulling, tightness, or discomfort as you do these exercises. Stop right away if you feel sudden pain or your pain gets worse. Do not begin these exercises until told by your provider. Stretching and range-of-motion exercises These exercises warm up your muscles  and joints. They can improve the movement and flexibility of your lower leg and ankle. They also help to relieve pain and stiffness. Gastroc and soleus stretch This exercise is also called a calf stretch. It stretches the muscles in the back of the lower leg. These muscles are the gastrocnemius, or gastroc, and the soleus. Sit on the floor with your left / right leg extended. Loop a belt or towel around the ball of your left / right foot. The ball of your foot is on the walking surface, right under your toes. Keep your left / right ankle and foot relaxed and keep your knee straight. Use the belt or towel to pull your foot toward you. You should feel a gentle stretch behind your calf or knee in your gastroc muscle. Hold this position for __________ seconds, then release to the starting position. Repeat the exercise with your knee  bent. You can put a pillow or a rolled bath towel under your knee to support it. You should feel a stretch deep in your calf in the soleus muscle or at your Achilles tendon. Repeat __________ times. Complete this exercise __________ times a day. Ankle alphabet  Sit with your left / right leg supported at the lower leg. Do not rest your foot on anything. Make sure your foot has room to move freely. Think of your left / right foot as a paintbrush. Move your foot to trace each letter of the alphabet in the air. Keep your hip and knee still while you trace. Make the letters as large as you can without feeling discomfort. Trace every letter from A to Z. Repeat __________ times. Complete this exercise __________ times a day. Strengthening exercises These exercises build strength and endurance in your ankle and lower leg. Endurance is the ability to use your muscles for a long time, even after they get tired. Ankle dorsiflexion  Secure a rubber exercise band or tube to an object, such as a table leg, that will stay still when the band is pulled. Secure the other end around  your left / right foot. Sit on the floor facing the object, with your left / right leg extended. The band or tube should be slightly tense when your foot is relaxed. Slowly bring your foot toward you, bringing the top of your foot toward your shin (dorsiflexion), and pulling the band tighter. Hold this position for __________ seconds. Slowly return your foot to the starting position. Repeat __________ times. Complete this exercise __________ times a day. Ankle plantar flexion  Sit on the floor with your left / right leg extended. Loop a rubber exercise tube or band around the ball of your left / right foot. The ball of your foot is on the walking surface, right under your toes. Hold the ends of the band or tube in your hands. The band or tube should be slightly tense when your foot is relaxed. Slowly point your foot and toes downward to tilt the top of your foot away from your shin (plantar flexion). Hold this position for __________ seconds. Slowly return your foot to the starting position. Repeat __________ times. Complete this exercise __________ times a day. Ankle eversion  Sit on the floor with your legs straight out in front of you. Loop a rubber exercise band or tube around the ball of your left / right foot. The ball of your foot is on the walking surface, right under your toes. Hold the ends of the band in your hands or secure the band to a stable object. The band or tube should be slightly tense when your foot is relaxed. Slowly push your foot outward, away from your other leg (eversion). Hold this position for __________ seconds. Slowly return your foot to the starting position. Repeat __________ times. Complete this exercise __________ times a day. This information is not intended to replace advice given to you by your health care provider. Make sure you discuss any questions you have with your health care provider. Document Revised: 06/25/2022 Document Reviewed:  06/25/2022 Elsevier Patient Education  2024 [employer].

## 2024-08-12 ENCOUNTER — Other Ambulatory Visit: Payer: Self-pay | Admitting: Podiatry

## 2024-08-19 ENCOUNTER — Ambulatory Visit: Admitting: Podiatry

## 2024-09-09 ENCOUNTER — Ambulatory Visit: Payer: Self-pay

## 2024-09-13 ENCOUNTER — Other Ambulatory Visit: Payer: Self-pay | Admitting: Podiatry

## 2024-09-16 ENCOUNTER — Ambulatory Visit: Admitting: Podiatry

## 2024-09-16 VITALS — Ht 65.0 in | Wt 200.0 lb

## 2024-09-16 DIAGNOSIS — M722 Plantar fascial fibromatosis: Secondary | ICD-10-CM

## 2024-09-16 DIAGNOSIS — B351 Tinea unguium: Secondary | ICD-10-CM | POA: Diagnosis not present

## 2024-09-16 DIAGNOSIS — M778 Other enthesopathies, not elsewhere classified: Secondary | ICD-10-CM | POA: Diagnosis not present

## 2024-09-16 DIAGNOSIS — M7752 Other enthesopathy of left foot: Secondary | ICD-10-CM

## 2024-09-16 MED ORDER — EFINACONAZOLE 10 % EX SOLN: 1.0000 [drp] | Freq: Every day | CUTANEOUS | 11 refills | Status: AC

## 2024-09-16 NOTE — Progress Notes (Signed)
 Subjective:  Chief Complaint  Patient presents with   Ankle Pain    RM 14 Patient ishere for left foot pain. Pt states pain on the medial side of the foot.Pt states pain present for 1 week. Pt states no injury to the left foot.   39 year old female presents the office with above concerns.  She says the pain in her left ankle has resolved but the pain is now moved to the foot.  She points on the arch of the foot where she gets the majority discomfort and some of the top of the foot.  She does not recall any recent injuries.  She states most of her pain is in the morning and gets better with activity.  She takes meloxicam  which has not been helping her foot pain much.  She had no complications after the last injection.  Also asking about other medications for nail fungus.  She states that ciclopirox  is too sticky.  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  Objective: AAO x3, NAD-wear slip on style shoes. DP/PT pulses palpable bilaterally, CRT less than 3 seconds Nails unchanged There is no pain in the ankle today.  The majority of tenderness is localized to the medial band of plantar fascia in the arch of the foot and just proximal to the metatarsal heads.  There is no specific area pinpoint tenderness identified at this time.  No edema, erythema.  MMT 5/5. No pain with calf compression, swelling, warmth, erythema  Assessment: Capsulitis, plantar fasciitis/arch pain; onychomycosis  Plan: -All treatment options discussed with the patient including all alternatives, risks, complications.  -We discussed injection given the location we will hold off on this today but if symptoms persist we will likely proceed with injection, immobilization. -Continue meloxicam  as needed -We discussed stretching, icing on regular basis. -Night splint dispensed given warning discomfort. Static or dynamic ankle foot orthosis, including soft interface material, adjustable for fit, for positioning, may  be used for minimal ambulation, prefabricated, off-the-shelf was dispensed on the billed date of service  -She also has to proceed with custom orthotics.  She was measured for inserts today. -Prescribed Jublia  for nail fungus. -Patient encouraged to call the office with any questions, concerns, change in symptoms.   Return in about 6 weeks (around 10/28/2024), or if symptoms worsen or fail to improve.  [name] DPM

## 2024-09-16 NOTE — Patient Instructions (Addendum)
 For instructions on how to put on your Night Splint, please visit [email or web address]  --  Plantar Fasciitis (Heel Spur Syndrome) with Rehab The plantar fascia is a fibrous, ligament-like, soft-tissue structure that spans the bottom of the foot. Plantar fasciitis is a condition that causes pain in the foot due to inflammation of the tissue. SYMPTOMS  Pain and tenderness on the underneath side of the foot. Pain that worsens with standing or walking. CAUSES  Plantar fasciitis is caused by irritation and injury to the plantar fascia on the underneath side of the foot. Common mechanisms of injury include: Direct trauma to bottom of the foot. Damage to a small nerve that runs under the foot where the main fascia attaches to the heel bone. Stress placed on the plantar fascia due to bone spurs. RISK INCREASES WITH:  Activities that place stress on the plantar fascia (running, jumping, pivoting, or cutting). Poor strength and flexibility. Improperly fitted shoes. Tight calf muscles. Flat feet. Failure to warm-up properly before activity. Obesity. PREVENTION Warm up and stretch properly before activity. Allow for adequate recovery between workouts. Maintain physical fitness: Strength, flexibility, and endurance. Cardiovascular fitness. Maintain a health body weight. Avoid stress on the plantar fascia. Wear properly fitted shoes, including arch supports for individuals who have flat feet.  PROGNOSIS  If treated properly, then the symptoms of plantar fasciitis usually resolve without surgery. However, occasionally surgery is necessary.  RELATED COMPLICATIONS  Recurrent symptoms that may result in a chronic condition. Problems of the lower back that are caused by compensating for the injury, such as limping. Pain or weakness of the foot during push-off following surgery. Chronic inflammation, scarring, and partial or complete fascia tear, occurring more often from repeated  injections.  TREATMENT  Treatment initially involves the use of ice and medication to help reduce pain and inflammation. The use of strengthening and stretching exercises may help reduce pain with activity, especially stretches of the Achilles tendon. These exercises may be performed at home or with a therapist. Your caregiver may recommend that you use heel cups of arch supports to help reduce stress on the plantar fascia. Occasionally, corticosteroid injections are given to reduce inflammation. If symptoms persist for greater than 6 months despite non-surgical (conservative), then surgery may be recommended.   MEDICATION  If pain medication is necessary, then nonsteroidal anti-inflammatory medications, such as aspirin and ibuprofen , or other minor pain relievers, such as acetaminophen , are often recommended. Do not take pain medication within 7 days before surgery. Prescription pain relievers may be given if deemed necessary by your caregiver. Use only as directed and only as much as you need. Corticosteroid injections may be given by your caregiver. These injections should be reserved for the most serious cases, because they may only be given a certain number of times.  HEAT AND COLD Cold treatment (icing) relieves pain and reduces inflammation. Cold treatment should be applied for 10 to 15 minutes every 2 to 3 hours for inflammation and pain and immediately after any activity that aggravates your symptoms. Use ice packs or massage the area with a piece of ice (ice massage). Heat treatment may be used prior to performing the stretching and strengthening activities prescribed by your caregiver, physical therapist, or athletic trainer. Use a heat pack or soak the injury in warm water.  SEEK IMMEDIATE MEDICAL CARE IF: Treatment seems to offer no benefit, or the condition worsens. Any medications produce adverse side effects.  EXERCISES- RANGE OF MOTION (ROM) AND STRETCHING EXERCISES - Plantar  Fasciitis (Heel Spur Syndrome) These exercises may help you when beginning to rehabilitate your injury. Your symptoms may resolve with or without further involvement from your physician, physical therapist or athletic trainer. While completing these exercises, remember:  Restoring tissue flexibility helps normal motion to return to the joints. This allows healthier, less painful movement and activity. An effective stretch should be held for at least 30 seconds. A stretch should never be painful. You should only feel a gentle lengthening or release in the stretched tissue.  RANGE OF MOTION - Toe Extension, Flexion Sit with your right / left leg crossed over your opposite knee. Grasp your toes and gently pull them back toward the top of your foot. You should feel a stretch on the bottom of your toes and/or foot. Hold this stretch for 10 seconds. Now, gently pull your toes toward the bottom of your foot. You should feel a stretch on the top of your toes and or foot. Hold this stretch for 10 seconds. Repeat  times. Complete this stretch 3 times per day.   RANGE OF MOTION - Ankle Dorsiflexion, Active Assisted Remove shoes and sit on a chair that is preferably not on a carpeted surface. Place right / left foot under knee. Extend your opposite leg for support. Keeping your heel down, slide your right / left foot back toward the chair until you feel a stretch at your ankle or calf. If you do not feel a stretch, slide your bottom forward to the edge of the chair, while still keeping your heel down. Hold this stretch for 10 seconds. Repeat 3 times. Complete this stretch 2 times per day.   STRETCH  Gastroc, Standing Place hands on wall. Extend right / left leg, keeping the front knee somewhat bent. Slightly point your toes inward on your back foot. Keeping your right / left heel on the floor and your knee straight, shift your weight toward the wall, not allowing your back to arch. You should feel a  gentle stretch in the right / left calf. Hold this position for 10 seconds. Repeat 3 times. Complete this stretch 2 times per day.  STRETCH  Soleus, Standing Place hands on wall. Extend right / left leg, keeping the other knee somewhat bent. Slightly point your toes inward on your back foot. Keep your right / left heel on the floor, bend your back knee, and slightly shift your weight over the back leg so that you feel a gentle stretch deep in your back calf. Hold this position for 10 seconds. Repeat 3 times. Complete this stretch 2 times per day.  STRETCH  Gastrocsoleus, Standing  Note: This exercise can place a lot of stress on your foot and ankle. Please complete this exercise only if specifically instructed by your caregiver.  Place the ball of your right / left foot on a step, keeping your other foot firmly on the same step. Hold on to the wall or a rail for balance. Slowly lift your other foot, allowing your body weight to press your heel down over the edge of the step. You should feel a stretch in your right / left calf. Hold this position for 10 seconds. Repeat this exercise with a slight bend in your right / left knee. Repeat 3 times. Complete this stretch 2 times per day.   STRENGTHENING EXERCISES - Plantar Fasciitis (Heel Spur Syndrome)  These exercises may help you when beginning to rehabilitate your injury. They may resolve your symptoms with or without further involvement  from your physician, physical therapist or athletic trainer. While completing these exercises, remember:  Muscles can gain both the endurance and the strength needed for everyday activities through controlled exercises. Complete these exercises as instructed by your physician, physical therapist or athletic trainer. Progress the resistance and repetitions only as guided.  STRENGTH - Towel Curls Sit in a chair positioned on a non-carpeted surface. Place your foot on a towel, keeping your heel on the  floor. Pull the towel toward your heel by only curling your toes. Keep your heel on the floor. Repeat 3 times. Complete this exercise 2 times per day.  STRENGTH - Ankle Inversion Secure one end of a rubber exercise band/tubing to a fixed object (table, pole). Loop the other end around your foot just before your toes. Place your fists between your knees. This will focus your strengthening at your ankle. Slowly, pull your big toe up and in, making sure the band/tubing is positioned to resist the entire motion. Hold this position for 10 seconds. Have your muscles resist the band/tubing as it slowly pulls your foot back to the starting position. Repeat 3 times. Complete this exercises 2 times per day.  Document Released: 09/01/2005 Document Revised: 11/24/2011 Document Reviewed: 12/14/2008 Exit[employer] Patient Information 2014 [employer], [place].  --  Efinaconazole Topical solution What is this medication? EFINACONAZOLE (e FEE na KON a zole) treats fungal infections of the nails. It belongs to a group of medications called antifungals. It will not treat infections caused by bacteria or viruses. This medicine may be used for other purposes; ask your health care provider or pharmacist if you have questions. COMMON BRAND NAME(S): JUBLIA What should I tell my care team before I take this medication? They need to know if you have any of these conditions: An unusual or allergic reaction to efinaconazole, other medications, foods, dyes or preservatives Pregnant or trying to get pregnant Breast-feeding How should I use this medication? This medication is for external use only. Do not take by mouth. Wash your hands before and after use. Do not get it in your eyes. If you do, rinse your eyes with plenty of cool tap water. Use it as directed on the prescription label. Do not use it more often than directed. Use the medication for the full course as directed by your care team, even if you think you are better.  Do not stop using it unless your care team tells you to stop it early. This medication comes with INSTRUCTIONS FOR USE. Ask your pharmacist for directions on how to use this medication. Read the information carefully. Talk to your pharmacist or care team if you have questions. Talk to your care team about the use of this medication in children. While it may be prescribed for children as young as 6 years for selected conditions, precautions do apply. Overdosage: If you think you have taken too much of this medicine contact a poison control center or emergency room at once. NOTE: This medicine is only for you. Do not share this medicine with others. What if I miss a dose? If you miss a dose, use it as soon as you can. If it is almost time for your next dose, use only that dose. Do not use double or extra doses. What may interact with this medication? Interactions are not expected. Do not use any other skin products on the same area of skin without talking to your care team. This list may not describe all possible interactions. Give your health care  provider a list of all the medicines, herbs, non-prescription drugs, or dietary supplements you use. Also tell them if you smoke, drink alcohol, or use illegal drugs. Some items may interact with your medicine. What should I watch for while using this medication? Visit your care team for regular checks on your progress. It may be some time before you see the benefit from this medication. Do not use nail polish or other nail cosmetic products on the treated nails. What side effects may I notice from receiving this medication? Side effects that you should report to your care team as soon as possible: Allergic reactions--skin rash, itching, hives, swelling of the face, lips, tongue, or throat Side effects that usually do not require medical attention (report to your care team if they continue or are bothersome): Ingrown nails Mild skin irritation, redness, or  dryness This list may not describe all possible side effects. Call your doctor for medical advice about side effects. You may report side effects to FDA at 1-800-FDA-1088. Where should I keep my medication? Keep out of the reach of children and pets. Store at room temperature between 20 and 25 degrees C (68 and 77 degrees F). Do not freeze. Keep the container tightly closed. Get rid of any unused medication after the expiration date. This medication is flammable. Avoid exposure to heat, flame, and smoking. To get rid of medications that are no longer needed or have expired: Take the medication to a medication take-back program. Check with your pharmacy or law enforcement to find a location. If you cannot return the medication, ask your pharmacist or care team how to get rid of this medication safely. NOTE: This sheet is a summary. It may not cover all possible information. If you have questions about this medicine, talk to your doctor, pharmacist, or health care provider.  2024 [employer] (2021-11-11 00:00:00)

## 2024-09-23 ENCOUNTER — Ambulatory Visit: Admitting: Podiatry

## 2024-09-28 ENCOUNTER — Encounter: Payer: Self-pay | Admitting: Podiatry

## 2024-09-28 ENCOUNTER — Other Ambulatory Visit: Payer: Self-pay | Admitting: Podiatry

## 2024-09-28 MED ORDER — CICLOPIROX OLAMINE 0.77 % EX CREA: TOPICAL_CREAM | Freq: Two times a day (BID) | CUTANEOUS | 2 refills | Status: AC

## 2024-10-20 ENCOUNTER — Other Ambulatory Visit: Payer: Self-pay | Admitting: Podiatry

## 2024-10-28 ENCOUNTER — Ambulatory Visit: Admitting: Podiatry
